# Patient Record
Sex: Female | Born: 1980 | Race: Black or African American | Hispanic: No | Marital: Married
Health system: Midwestern US, Community
[De-identification: ages and names within clinical notes are randomized; demographics above are authoritative.]

## PROBLEM LIST (undated history)

## (undated) DIAGNOSIS — D251 Intramural leiomyoma of uterus: Principal | ICD-10-CM

## (undated) DIAGNOSIS — D509 Iron deficiency anemia, unspecified: Secondary | ICD-10-CM

## (undated) DIAGNOSIS — Z1231 Encounter for screening mammogram for malignant neoplasm of breast: Secondary | ICD-10-CM

## (undated) DIAGNOSIS — D259 Leiomyoma of uterus, unspecified: Secondary | ICD-10-CM

## (undated) DIAGNOSIS — D649 Anemia, unspecified: Secondary | ICD-10-CM

## (undated) HISTORY — DX: Anemia, unspecified: D64.9

## (undated) HISTORY — PX: DILATION AND CURETTAGE OF UTERUS: SHX78

## (undated) HISTORY — DX: Leiomyoma of uterus, unspecified: D25.9

---

## 2015-05-29 ENCOUNTER — Encounter: Payer: BLUE CROSS/BLUE SHIELD | Attending: Family | Primary: Family

## 2015-07-04 ENCOUNTER — Encounter: Payer: BLUE CROSS/BLUE SHIELD | Attending: Family | Primary: Family

## 2015-07-12 ENCOUNTER — Ambulatory Visit: Admit: 2015-07-12 | Discharge: 2015-07-12 | Payer: BLUE CROSS/BLUE SHIELD | Attending: Family | Primary: Family

## 2015-07-12 DIAGNOSIS — Z0000 Encounter for general adult medical examination without abnormal findings: Secondary | ICD-10-CM

## 2015-07-12 LAB — AMB POC URINALYSIS DIP STICK MANUAL W/ MICRO
Amorphous Crystals: 0
Bilirubin (UA POC): NEGATIVE
Blood (UA POC): NEGATIVE
Casts: 0 [HPF] (ref 0–1)
Crystals (UA POC): 0
Glucose (UA POC): NEGATIVE
Ketones (UA POC): NEGATIVE
Leukocyte esterase (UA POC): NEGATIVE
Nitrites (UA POC): NEGATIVE
Other:: 0
Protein (UA POC): NEGATIVE mg/dL
RBC: 0 [HPF] (ref 0–3)
Specific gravity (UA POC): 1.015 (ref 1.003–1.035)
Urobilinogen (UA POC): 0.2 (ref 0.2–1)
pH (UA POC): 5 (ref 4.6–8.0)

## 2015-07-12 MED ORDER — HYDROXYZINE 25 MG TAB
25 mg | ORAL_TABLET | Freq: Every evening | ORAL | 5 refills | Status: DC
Start: 2015-07-12 — End: 2016-01-10

## 2015-07-12 NOTE — Progress Notes (Signed)
Chief Complaint   Patient presents with   ??? Complete Physical     r#3   ??? Other     LMP  07/03/15        Dorothy Gomez is a 34 y.o. female in the office today for physical and establishment States she is doing well. She is fasting today for labs. She denies any headaches or vision changes. No tinnitus or dizziness. No difficulty swallowing. No shortness of breath, cough or congestion. No chest pain or palpitations. No epigastric pain. No nausea. Bowel movements are regular. No dark, tarry or bloody stools. She does report some abdominal pain when she eats gluten. States her mom has Celiac disease. Reports when she eats gluten her feet swell and her stomach hurts. Has very large bowel movements following. Complains of significant bloating. She wants to be tested for Celiac today. Denies breast changes. Periods are regular. Complains of menorrhagia and mild dysmenorrhea. She is due for pap today. She denies any muscle aches or joint pain. She does not sleep well. States diet has been balanced including fruits, vegetables and proteins. Patient is exercising regularly.  Reports occasional alcohol use and does not use tobacco products.  Does see the dentist regularly and has kept up with opthalmologic exams as appropriate.  When patient is out in sun for extended periods of time sun block is used. Reports stable mood.  ROS below for purpose of exam today.     Past Medical History   Diagnosis Date   ??? 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 Vitiligo        No current outpatient prescriptions on file prior to visit.     No current facility-administered medications on file prior to visit.        Past Surgical History   Procedure Laterality Date   ??? Hx gyn       D&C       Family History   Problem Relation Age of Onset   ??? Celiac Disease Mother    ??? Diabetes Mother    ??? Hypertension Mother    ??? Hypertension Father    ??? No Known Problems Sister    ??? Asthma Brother    ??? Hypertension Maternal Grandmother     ??? Diabetes Maternal Grandmother    ??? No Known Problems Maternal Grandfather    ??? Breast Cancer Paternal Grandmother    ??? Diabetes Paternal Grandmother    ??? Hypertension Paternal Grandmother    ??? Heart Attack Paternal Grandfather        No Known Allergies    Social History     Social History   ??? Marital status: SINGLE     Spouse name: N/A   ??? Number of children: N/A   ??? Years of education: N/A     Occupational History   ??? Not on file.     Social History Main Topics   ??? Smoking status: Former Smoker     Quit date: 05/27/2015   ??? Smokeless tobacco: Never Used   ??? Alcohol use 4.2 oz/week     3 Glasses of wine, 0 Cans of beer, 4 Shots of liquor per week   ??? Drug use: No   ??? Sexual activity: Not Currently     Other Topics Concern   ??? Not on file     Social History Narrative       Review of Systems -  General ROS: negative for - chills, fatigue,  fever, night sweats or weight loss  Psychological ROS: negative for - anxiety, depression, mood swings or sleep disturbances  Ophthalmic ROS: negative for - blurry vision, decreased vision, double vision, dry eyes, eye pain or loss of vision  ENT ROS: negative for - epistaxis, headaches, hearing change, nasal congestion, nasal discharge, oral lesions, sinus pain, sneezing, sore throat or tinnitus  Allergy and Immunology ROS: negative for - hives, itchy/watery eyes, nasal congestion, postnasal drip or seasonal allergies  Hematological and Lymphatic ROS: negative for - bleeding problems, bruising, fatigue, night sweats or swollen lymph nodes  Endocrine ROS: negative for - malaise/lethargy, polydipsia/polyuria or unexpected weight changes  Breast ROS: negative for breast lumps  Respiratory ROS: no cough, shortness of breath, or wheezing  Cardiovascular ROS: no chest pain or dyspnea on exertion  Gastrointestinal ROS: no abdominal pain, change in bowel habits, or black or bloody stools  Genito-Urinary ROS: no dysuria, trouble voiding, or hematuria   Musculoskeletal ROS: negative negative for - gait disturbance, joint pain, joint stiffness, joint swelling or muscular weakness  Neurological ROS: negative for - dizziness, numbness/tingling, tremors or weakness  Dermatological ROS: negative for - dry skin, eczema, mole changes or rash      Visit Vitals   ??? BP 104/68   ??? Temp 97.7 ??F (36.5 ??C) (Tympanic)   ??? Ht 5' 4.75" (1.645 m)   ??? Wt 191 lb (86.6 kg)   ??? LMP 07/03/2015   ??? BMI 32.03 kg/m2         Physical Examination:   General appearance - alert, well appearing, and in no distress, oriented to person, place, and time, normal appearing weight and acyanotic, in no respiratory distress  Mental status - alert, oriented to person, place, and time, normal mood, behavior, speech, dress, motor activity, and thought processes  Eyes - pupils equal and reactive, extraocular eye movements intact, sclera anicteric  Ears - bilateral TM's and external ear canals normal  Nose - normal and patent, no erythema, discharge or polyps  Mouth - mucous membranes moist, pharynx normal without lesions, tonsils normal, dental hygiene good and tongue normal  Neck - supple, no significant adenopathy, carotids upstroke normal bilaterally, no bruits, thyroid exam: thyroid is normal in size without nodules or tenderness  Lymphatics - no palpable lymphadenopathy, no hepatosplenomegaly  Chest - clear to auscultation, no wheezes, rales or rhonchi, symmetric air entry  Breast - breasts appear normal, no suspicious masses, no skin or nipple changes or axillary nodes.  Heart - normal rate, regular rhythm, normal S1, S2, no murmurs, rubs, clicks or gallops  Abdomen - soft, nontender, nondistended, no masses or organomegaly  GU - deferred to GYN - normal external genitalia, vulva, vagina, cervix, uterus and adnexa, PAP: Pap smear done today, exam chaperoned  Back exam - full range of motion, no tenderness, palpable spasm or pain on motion   Neurological - alert, oriented, normal speech, no focal findings or movement disorder noted  Musculoskeletal - no joint tenderness, deformity or swelling  Extremities - peripheral pulses normal, no pedal edema, no clubbing or cyanosis  Skin - normal coloration and turgor, no rashes, no suspicious skin lesions noted    Assessment/plan    1. Routine general medical examination at a health care facility  Overall appears to be in good physical condition.  Routine screening labs obtained and will be reviewed upon return. Encouraged to start or maintain healthy diet and or routine exercise. Reviewed and discussed appropriate vaccines and screenings.  Reviewed safety issues  common for age bracket    2. Laboratory examination ordered as part of a routine general medical examination  All labs will be reviewed in detail and addressed as needed.   - CBC WITH AUTOMATED DIFF  - METABOLIC PANEL, COMPREHENSIVE  - LIPID PANEL WITH LDL/HDL RATIO  - TSH 3RD GENERATION  - AMB POC URINALYSIS DIP STICK MANUAL W/ MICRO  - COLLECTION VENOUS BLOOD,VENIPUNCTURE    3. Encounter for routine gynecological examination with Papanicolaou smear of cervix  - Pap (Image Guided), Aptima HPV, rfx 16/18,45 ([id number] /[id number])    4. Lower abdominal pain  Reviewed avoidance of trigger foods.  - Celiac Disease Ab Screen w/Rfx ([id number] /[id number])    5. BMI 32.0-32.9,adult  Discussed the patient's above normal BMI with her.  I have recommended the following interventions: dietary management education, guidance, and counseling .  The BMI follow up plan is as follows: BMI is out of normal parameters and plan is as follows: I have counseled this patient on diet and exercise regimens        Orders Placed This Encounter   ??? COLLECTION VENOUS BLOOD,VENIPUNCTURE   ??? Celiac Disease Ab Screen w/Rfx ([id number] /[id number])   ??? CBC WITH AUTOMATED DIFF   ??? METABOLIC PANEL, COMPREHENSIVE   ??? LIPID PANEL WITH LDL/HDL RATIO   ??? TSH 3RD GENERATION    ??? AMB POC URINALYSIS DIP STICK MANUAL W/ MICRO   ??? Pap (Image Guided), Aptima HPV, rfx 16/18,45 [phone])     Order Specific Question:   Pap Source?     Answer:   Cervical     Order Specific Question:   Total Hysterectomy?     Answer:   No     Order Specific Question:   Supracervical Hysterectomy?     Answer:   No     Order Specific Question:   Post Menopausal?     Answer:   No     Order Specific Question:   Hormone Therapy?     Answer:   No     Order Specific Question:   IUD?     Answer:   No     Order Specific Question:   Abnormal Bleeding?     Answer:   No     Order Specific Question:   Pregnant     Answer:   No     Order Specific Question:   Post Partum?     Answer:   No     Order Specific Question:   Date of LMP?     Answer:   07/03/2015     Order Specific Question:   Pap collection method?     Answer:   Jefm Petty, NP

## 2015-07-12 NOTE — Patient Instructions (Addendum)
Learning About Sleeping Well  What does sleeping well mean?  Sleeping well means getting enough sleep. How much sleep is enough varies among people.  The number of hours you sleep is not as important as how you feel when you wake up. If you do not feel refreshed, you probably need more sleep. Another sign of not getting enough sleep is feeling tired during the day.  The average total nightly sleep time is 7?? to 8 hours. Healthy adults may need a little more or a little less than this.  Why is getting enough sleep important?  Getting enough quality sleep is a basic part of good health. When your sleep suffers, your mood and your thoughts can suffer too. You may find yourself feeling more grumpy or stressed. Not getting enough sleep also can lead to serious problems, including injury, accidents, anxiety, and depression.  What might cause poor sleeping?  Many things can cause sleep problems, including:  ?? Stress. Stress can be caused by fear about a single event, such as giving a speech. Or you may have ongoing stress, such as worry about work or school.  ?? Depression, anxiety, and other mental or emotional conditions.  ?? Changes in your sleep habits or surroundings. This includes changes that happen where you sleep, such as noise, light, or sleeping in a different bed. It also includes changes in your sleep pattern, such as having jet lag or working a late shift.  ?? Health problems, such as pain, breathing problems, and restless legs syndrome.  ?? Lack of regular exercise.  How can you help yourself?  Here are some tips that may help you sleep more soundly and wake up feeling more refreshed.  Your sleeping area   ?? Use your bedroom only for sleeping and sex. A bit of light reading may help you fall asleep. But if it doesn't, do your reading elsewhere in the house. Don't watch TV in bed.  ?? Be sure your bed is big enough to stretch out comfortably, especially if you have a sleep partner.   ?? Keep your bedroom quiet, dark, and cool. Use curtains, blinds, or a sleep mask to block out light. To block out noise, use earplugs, soothing music, or a "white noise" machine.  Your evening and bedtime routine   ?? Create a relaxing bedtime routine. You might want to take a warm shower or bath, listen to soothing music, or drink a cup of noncaffeinated tea.  ?? Go to bed at the same time every night. And get up at the same time every morning, even if you feel tired.  What to avoid   ?? Limit caffeine (coffee, tea, caffeinated sodas) during the day, and don't have any for at least 4 to 6 hours before bedtime.  ?? Don't drink alcohol before bedtime. Alcohol can cause you to wake up more often during the night.  ?? Don't smoke or use tobacco, especially in the evening. Nicotine can keep you awake.  ?? Don't take naps during the day, especially close to bedtime.  ?? Don't lie in bed awake for too long. If you can't fall asleep, or if you wake up in the middle of the night and can't get back to sleep within 15 minutes or so, get out of bed and go to another room until you feel sleepy.  ?? Don't take medicine right before bed that may keep you awake or make you feel hyper or energized. Your doctor can tell you if your   medicine may do this and if you can take it earlier in the day.  If you can't sleep   ?? Imagine yourself in a peaceful, pleasant scene. Focus on the details and feelings of being in a place that is relaxing.  ?? Get up and do a quiet or boring activity until you feel sleepy.  ?? Don't drink any liquids after 6 p.m. if you wake up often because you have to go to the bathroom.  Where can you learn more?  Go to http://www.healthwise.net/GoodHelpConnections  Enter J942 in the search box to learn more about "Learning About Sleeping Well."  ?? 2006-2016 [employer]. Care instructions adapted under license by [employer] (which disclaims liability or warranty  for this information). This care instruction is for use with your licensed healthcare professional. If you have questions about a medical condition or this instruction, always ask your healthcare professional. Healthwise, Incorporated disclaims any warranty or liability for your use of this information.  Content Version: 10.9.538570; Current as of: October 16, 2014

## 2015-07-13 LAB — LIPID PANEL WITH LDL/HDL RATIO
Cholesterol, total: 160 mg/dL (ref 100–199)
HDL Cholesterol: 66 mg/dL (ref >39)
LDL, calculated: 79 mg/dL (ref 0–99)
LDL/HDL Ratio: 1.2 ratio units (ref 0.0–3.2)
Triglyceride: 75 mg/dL (ref 0–149)
VLDL, calculated: 15 mg/dL (ref 5–40)

## 2015-07-13 LAB — CBC WITH AUTOMATED DIFF
ABS. BASOPHILS: 0 10*3/uL (ref 0.0–0.2)
ABS. EOSINOPHILS: 0 10*3/uL (ref 0.0–0.4)
ABS. IMM. GRANS.: 0 10*3/uL (ref 0.0–0.1)
ABS. MONOCYTES: 0.3 10*3/uL (ref 0.1–0.9)
ABS. NEUTROPHILS: 4.7 10*3/uL (ref 1.4–7.0)
Abs Lymphocytes: 1.7 10*3/uL (ref 0.7–3.1)
BASOPHILS: 0 %
EOSINOPHILS: 1 %
HCT: 35.6 % (ref 34.0–46.6)
HGB: 10.5 g/dL — ABNORMAL LOW (ref 11.1–15.9)
IMMATURE GRANULOCYTES: 0 %
Lymphocytes: 25 %
MCH: 20.5 pg — ABNORMAL LOW (ref 26.6–33.0)
MCHC: 29.5 g/dL — ABNORMAL LOW (ref 31.5–35.7)
MCV: 69 fL — ABNORMAL LOW (ref 79–97)
MONOCYTES: 5 %
NEUTROPHILS: 69 %
PLATELET: 234 10*3/uL (ref 150–379)
RBC: 5.13 x10E6/uL (ref 3.77–5.28)
RDW: 17.1 % — ABNORMAL HIGH (ref 12.3–15.4)
WBC: 6.8 10*3/uL (ref 3.4–10.8)

## 2015-07-13 LAB — METABOLIC PANEL, COMPREHENSIVE
A-G Ratio: 1.5 (ref 1.1–2.5)
ALT (SGPT): 9 IU/L (ref 0–32)
AST (SGOT): 13 IU/L (ref 0–40)
Albumin: 4.1 g/dL (ref 3.5–5.5)
Alk. phosphatase: 67 IU/L (ref 39–117)
BUN/Creatinine ratio: 12 (ref 8–20)
BUN: 9 mg/dL (ref 6–20)
Bilirubin, total: 0.4 mg/dL (ref 0.0–1.2)
CO2: 24 mmol/L (ref 18–29)
Calcium: 9.2 mg/dL (ref 8.7–10.2)
Chloride: 101 mmol/L (ref 97–108)
Creatinine: 0.77 mg/dL (ref 0.57–1.00)
GFR est AA: 115 mL/min/{1.73_m2} (ref >59)
GFR est non-AA: 100 mL/min/{1.73_m2} (ref >59)
GLOBULIN, TOTAL: 2.7 g/dL (ref 1.5–4.5)
Glucose: 94 mg/dL (ref 65–99)
Potassium: 4 mmol/L (ref 3.5–5.2)
Protein, total: 6.8 g/dL (ref 6.0–8.5)
Sodium: 140 mmol/L (ref 134–144)

## 2015-07-13 LAB — TSH 3RD GENERATION: TSH: 0.993 u[IU]/mL (ref 0.450–4.500)

## 2015-07-15 ENCOUNTER — Encounter

## 2015-07-15 LAB — CELIAC DISEASE AB SCREEN W/RFLX
DEAMIDATED GLIADIN ABS,IGA, 161651: 6 units (ref 0–19)
Deamidated Gliadin Ab, IgA: 6 units (ref 0–19)
Immunoglobulin A, Qt.: 233 mg/dL (ref 87–352)
Immunoglobulin A, Qt.: 233 mg/dL (ref 87–352)
T-TRANSGLUTAMINASE, IGA, 164643: <2 U/mL (ref 0–3)
t-Transglutaminase, IgA: <2 U/mL (ref 0–3)

## 2015-07-15 MED ORDER — FERROUS SULFATE 324 MG (65 MG IRON) TAB, DELAYED RELEASE
324 mg (65 mg iron) | ORAL_TABLET | Freq: Every day | ORAL | 1 refills | Status: DC
Start: 2015-07-15 — End: 2016-11-23

## 2015-07-15 NOTE — Progress Notes (Signed)
Anemic, liver and kidney function is good, glucose is normal, cholesterol is great, thyroid is normal, start iron supplement (sent to pharmacy on file), may cause constipation, increase water intake, otc stool softener as needed, dietary fiber

## 2015-07-15 NOTE — Progress Notes (Signed)
Pt notified and voiced understanding  Pt wants to know if her celiac lab results are back yet

## 2015-07-15 NOTE — Progress Notes (Signed)
Labs just resulted, negative celiac panel

## 2015-07-15 NOTE — Progress Notes (Signed)
lvm w/ results per pts request

## 2015-07-16 LAB — PAP IG, APTIMA HPV AND RFX 16/18,45 (199305)
HPV Aptima: NEGATIVE
LABCORP 019018: 0

## 2015-07-16 LAB — PAP IG, APTIMA HPV AND RFX 16/18,45 (507805)
.: 0
HPV APTIMA: NEGATIVE

## 2015-07-16 NOTE — Progress Notes (Signed)
Normal pap, negative HPV

## 2015-07-17 NOTE — Progress Notes (Signed)
Left voice message asking if it's ok to leave  results on voice mail

## 2015-07-17 NOTE — Progress Notes (Signed)
Left message with below info

## 2015-07-26 NOTE — Telephone Encounter (Signed)
Lab appt will be ok

## 2015-07-26 NOTE — Telephone Encounter (Signed)
Said that she had a low vitamin D levels in the past and she would like to have it checked again.  Does she need to make an appt to see you or can she just get a lab appt?

## 2015-07-26 NOTE — Telephone Encounter (Signed)
Pt notified and voiced understanding

## 2016-01-10 ENCOUNTER — Ambulatory Visit: Admit: 2016-01-10 | Discharge: 2016-01-10 | Payer: BLUE CROSS/BLUE SHIELD | Attending: Family | Primary: Family

## 2016-01-10 DIAGNOSIS — N920 Excessive and frequent menstruation with regular cycle: Secondary | ICD-10-CM

## 2016-01-10 NOTE — Progress Notes (Signed)
Chief Complaint   Patient presents with   ??? Menstrual Problem     very painful.  r#3   ??? Other     DERM referal   ??? Anxiety       HPI:  Dorothy Gomez is a 35 y.o. female presenting to the office today for an acute visit. States her menstrual cycles has become very painful over the past year. She complains of menorrhagia. Reports length is 5 days. States she is worried about fibroids and endometriosis. Discussed trial OCP and patient states she does not do well on "hormones". We will refer her to GYN for further evaluation and management. She is going through a divorce and has increased anxiety. She denies any sadness or anhedonia. She does have some insomnia. No suicidal or homicidal ideations. She is going to see a new therapist this coming week. States will follow up in our office if she continues to have symptoms following therapy. States her vitiligo is worsened with her anxiety. She would like to be referred to Dermatology. She denies further needs or complaints today.     Past Medical History:   Diagnosis Date   ??? 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 Vitiligo        Social History     Social History   ??? Marital status: SINGLE     Spouse name: N/A   ??? Number of children: N/A   ??? Years of education: N/A     Occupational History   ??? Not on file.     Social History Main Topics   ??? Smoking status: Former Smoker     Quit date: 05/27/2015   ??? Smokeless tobacco: Never Used   ??? Alcohol use 4.2 oz/week     3 Glasses of wine, 0 Cans of beer, 4 Shots of liquor per week   ??? Drug use: No   ??? Sexual activity: Not Currently     Other Topics Concern   ??? Not on file     Social History Narrative       Family History   Problem Relation Age of Onset   ??? Celiac Disease Mother    ??? Diabetes Mother    ??? Hypertension Mother    ??? Hypertension Father    ??? No Known Problems Sister    ??? Asthma Brother    ??? Hypertension Maternal Grandmother    ??? Diabetes Maternal Grandmother     ??? No Known Problems Maternal Grandfather    ??? Breast Cancer Paternal Grandmother    ??? Diabetes Paternal Grandmother    ??? Hypertension Paternal Grandmother    ??? Heart Attack Paternal Grandfather        Current Outpatient Prescriptions   Medication Sig Dispense Refill   ??? ferrous sulfate 324 mg (65 mg iron) tablet Take 1 Tab by mouth Daily (before breakfast). 90 Tab 1       Review of Systems - History obtained from the patient  General ROS: negative for - fatigue, fever or weight loss  Psychological ROS: positive for - anxiety  negative for - concentration difficulties, depression, sleep disturbances or suicidal ideation  Respiratory ROS: no cough, shortness of breath, or wheezing  Cardiovascular ROS: no chest pain or dyspnea on exertion  Genito-Urinary ROS: positive for - dysmenorrhea and dysmenorrhea    Vitals:    01/10/16 1125   BP: 112/72   Temp: 96.2 ??F (35.7 ??C)   TempSrc: Tympanic   Weight: 194  lb (88 kg)   Height: 5' 4.75" (1.645 m)       Physical Examination: General appearance - alert, well appearing, and in no distress  Mental status - alert, oriented to person, place, and time, normal mood, behavior, speech, dress, motor activity, and thought processes  Neck - supple, no significant adenopathy  Chest - clear to auscultation, no wheezes, rales or rhonchi, symmetric air entry  Heart - normal rate, regular rhythm, normal S1, S2, no murmurs, rubs, clicks or gallops  Neurological - alert, oriented, normal speech, no focal findings or movement disorder noted  Extremities - peripheral pulses normal, no pedal edema, no clubbing or cyanosis  Skin - normal coloration and turgor    Assessment and Plan:    1. Menorrhagia with regular cycle  - OB/GYN - [facility]    2. Vitiligo  - DERMATOLOGY    3. Anxiety  States going to see [place], therapist on [address] next week. Will follow up if she feels therapy is not adequate for initiation of medication.       Adria Dill, NP

## 2016-01-10 NOTE — Patient Instructions (Addendum)
Anxiety Disorder: Care Instructions  Your Care Instructions  Anxiety is a normal reaction to stress. Difficult situations can cause you to have symptoms such as sweaty palms and a nervous feeling.  In an anxiety disorder, the symptoms are far more severe. Constant worry, muscle tension, trouble sleeping, nausea and diarrhea, and other symptoms can make normal daily activities difficult or impossible. These symptoms may occur for no reason, and they can affect your work, school, or social life. Medicines, counseling, and self-care can all help.  Follow-up care is a key part of your treatment and safety. Be sure to make and go to all appointments, and call your doctor if you are having problems. It's also a good idea to know your test results and keep a list of the medicines you take.  How can you care for yourself at home?  ?? Take medicines exactly as directed. Call your doctor if you think you are having a problem with your medicine.  ?? Go to your counseling sessions and follow-up appointments.  ?? Recognize and accept your anxiety. Then, when you are in a situation that makes you anxious, say to yourself, "This is not an emergency. I feel uncomfortable, but I am not in danger. I can keep going even if I feel anxious."  ?? Be kind to your body:  ?? Relieve tension with exercise or a massage.  ?? Get enough rest.  ?? Avoid alcohol, caffeine, nicotine, and illegal drugs. They can increase your anxiety level and cause sleep problems.  ?? Learn and do relaxation techniques. See below for more about these techniques.  ?? Engage your mind. Get out and do something you enjoy. Go to a funny movie, or take a walk or hike. Plan your day. Having too much or too little to do can make you anxious.  ?? Keep a record of your symptoms. Discuss your fears with a good friend or family member, or join a support group for people with similar problems. Talking to others sometimes relieves stress.   ?? Get involved in social groups, or volunteer to help others. Being alone sometimes makes things seem worse than they are.  ?? Get at least 30 minutes of exercise on most days of the week to relieve stress. Walking is a good choice. You also may want to do other activities, such as running, swimming, cycling, or playing tennis or team sports.  Relaxation techniques  Do relaxation exercises 10 to 20 minutes a day. You can play soothing, relaxing music while you do them, if you wish.  ?? Tell others in your house that you are going to do your relaxation exercises. Ask them not to disturb you.  ?? Find a comfortable place, away from all distractions and noise.  ?? Lie down on your back, or sit with your back straight.  ?? Focus on your breathing. Make it slow and steady.  ?? Breathe in through your nose. Breathe out through either your nose or mouth.  ?? Breathe deeply, filling up the area between your navel and your rib cage. Breathe so that your belly goes up and down.  ?? Do not hold your breath.  ?? Breathe like this for 5 to 10 minutes. Notice the feeling of calmness throughout your whole body.  As you continue to breathe slowly and deeply, relax by doing the following for another 5 to 10 minutes:  ?? Tighten and relax each muscle group in your body. You can begin at your toes and work your   way up to your head.  ?? Imagine your muscle groups relaxing and becoming heavy.  ?? Empty your mind of all thoughts.  ?? Let yourself relax more and more deeply.  ?? Become aware of the state of calmness that surrounds you.  ?? When your relaxation time is over, you can bring yourself back to alertness by moving your fingers and toes and then your hands and feet and then stretching and moving your entire body. Sometimes people fall asleep during relaxation, but they usually wake up shortly afterward.  ?? Always give yourself time to return to full alertness before you drive a  car or do anything that might cause an accident if you are not fully alert. Never play a relaxation tape while you drive a car.  When should you call for help?  Call 911 anytime you think you may need emergency care. For example, call if:  ?? You feel you cannot stop from hurting yourself or someone else.  Keep the numbers for these national suicide hotlines: 1-800-273-TALK (1-800-273-8255) and 1-800-SUICIDE (1-800-784-2433). If you or someone you know talks about suicide or feeling hopeless, get help right away.  Watch closely for changes in your health, and be sure to contact your doctor if:  ?? You have anxiety or fear that affects your life.  ?? You have symptoms of anxiety that are new or different from those you had before.  Where can you learn more?  Go to http://www.healthwise.net/GoodHelpConnections.  Enter P754 in the search box to learn more about "Anxiety Disorder: Care Instructions."  Current as of: May 21, 2015  Content Version: 11.1  ?? 2006-2016 [employer], [employer]. Care instructions adapted under license by [employer] (which disclaims liability or warranty for this information). If you have questions about a medical condition or this instruction, always ask your healthcare professional. [employer], [employer] disclaims any warranty or liability for your use of this information.

## 2016-01-17 ENCOUNTER — Ambulatory Visit: Admit: 2016-01-17 | Discharge: 2016-01-17 | Payer: BLUE CROSS/BLUE SHIELD | Attending: Family | Primary: Family

## 2016-01-17 DIAGNOSIS — J329 Chronic sinusitis, unspecified: Secondary | ICD-10-CM

## 2016-01-17 LAB — AMB POC COMPLETE CBC,AUTOMATED ENTER (RFM)
GRANULOCYTES (POC): 66.7 % (ref 47.0–75.0)
HCT (POC): 38.8 % (ref 36.7–44.7)
HGB (POC): 11.7 g/dL — AB (ref 12.0–16.0)
LYMPHOCYTES (POC): 21.5 % (ref 15.0–45.0)
MCH (POC): 20 pg — AB (ref 26–34)
MCHC (POC): 30.1 g/dL — AB (ref 31–37)
MCV (POC): 66.6 fL — AB (ref 80–100)
MONOCYTES (POC): 11.8 % — AB (ref 2.0–11.0)
PLATELET (POC): 198 10*3/uL (ref 140–440)
RBC (POC): 5.83 10*6/uL — AB (ref 3.98–5.00)
WBC (POC): 10.1 10*3/uL — AB (ref 3.9–10.0)

## 2016-01-17 MED ORDER — ALBUTEROL SULFATE HFA 90 MCG/ACTUATION AEROSOL INHALER
90 mcg/actuation | RESPIRATORY_TRACT | 0 refills | Status: DC | PRN
Start: 2016-01-17 — End: 2016-12-02

## 2016-01-17 MED ORDER — CEFDINIR 300 MG CAP
300 mg | ORAL_CAPSULE | Freq: Every day | ORAL | 0 refills | Status: DC
Start: 2016-01-17 — End: 2016-11-23

## 2016-01-17 MED ORDER — BROMPHENIRAMINE-PSEUDOEPHEDRINE-DM 2 MG-30 MG-10 MG/5 ML SYRUP
2-30-10 mg/5 mL | Freq: Four times a day (QID) | ORAL | 0 refills | Status: DC | PRN
Start: 2016-01-17 — End: 2016-11-23

## 2016-01-17 MED ORDER — FLUCONAZOLE 150 MG TAB
150 mg | ORAL_TABLET | Freq: Every day | ORAL | 0 refills | Status: AC
Start: 2016-01-17 — End: 2016-01-18

## 2016-01-17 NOTE — Progress Notes (Signed)
Chief Complaint   Patient presents with   ??? Wheezing     cough, drainage, chest congestion since  yesterday.   r#1       HPI:  Dorothy Gomez is a 35 y.o. female presenting to the office today for an acute visit. States she began feeling poorly a few days ago. Feels worse since yesterday. Symptoms started with allergy like symptoms. She has sinus pain, pressure and congestion. She feels like her sinuses are draining. No sore throat. She has bilateral ear pressure and popping, no pain. She is coughing. Cough is not productive. She is wheezing. She does have some mild shortness of breath with activity. No chest pain or palpitations. No fever or chills. She feels fatigued. Has been taking OTC medication without relief. No further issues or complaints.     Past Medical History:   Diagnosis Date   ??? 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 Vitiligo        Social History     Social History   ??? Marital status: SINGLE     Spouse name: N/A   ??? Number of children: N/A   ??? Years of education: N/A     Occupational History   ??? Not on file.     Social History Main Topics   ??? Smoking status: Former Smoker     Quit date: 05/27/2015   ??? Smokeless tobacco: Never Used   ??? Alcohol use 4.2 oz/week     3 Glasses of wine, 0 Cans of beer, 4 Shots of liquor per week   ??? Drug use: No   ??? Sexual activity: Not Currently     Other Topics Concern   ??? Not on file     Social History Narrative       Family History   Problem Relation Age of Onset   ??? Celiac Disease Mother    ??? Diabetes Mother    ??? Hypertension Mother    ??? Hypertension Father    ??? No Known Problems Sister    ??? Asthma Brother    ??? Hypertension Maternal Grandmother    ??? Diabetes Maternal Grandmother    ??? No Known Problems Maternal Grandfather    ??? Breast Cancer Paternal Grandmother    ??? Diabetes Paternal Grandmother    ??? Hypertension Paternal Grandmother    ??? Heart Attack Paternal Grandfather        Current Outpatient Prescriptions   Medication Sig Dispense Refill    ??? ferrous sulfate 324 mg (65 mg iron) tablet Take 1 Tab by mouth Daily (before breakfast). 90 Tab 1       Review of Systems - History obtained from the patient  General ROS: positive for  - fatigue  negative for - chills or fever  ENT ROS: positive for - headaches, nasal congestion and ear pain and pressure, laryngitis  negative for - headaches, nasal discharge, sneezing, sore throat or tinnitus  Respiratory ROS: positive for - cough and shortness of breath  negative for - hemoptysis, orthopnea or wheezing  Cardiovascular ROS: no chest pain or dyspnea on exertion    Vitals:    01/17/16 1059   BP: 112/74   Temp: 97.8 ??F (36.6 ??C)   TempSrc: Tympanic   Weight: 195 lb (88.5 kg)   Height: 5' 4.75" (1.645 m)       Physical Examination: General appearance - acyanotic, in no respiratory distress and ill-appearing  Mental status - alert, oriented to person,  place, and time, normal mood, behavior, speech, dress, motor activity, and thought processes  Eyes - pupils equal and reactive, extraocular eye movements intact  Ears - right TM red, dull, bulging, left TM red, dull, bulging, clear effusions bilaterally  Nose - mucosal congestion and mucosal erythema  Mouth - mucous membranes moist, pharynx normal without lesions  Neck - supple, no significant adenopathy  Chest - clear to auscultation, no wheezes, rales or rhonchi, symmetric air entry  Heart - normal rate, regular rhythm, normal S1, S2, no murmurs, rubs, clicks or gallops  Neurological - alert, oriented, normal speech, no focal findings or movement disorder noted  Extremities - no pedal edema noted  Skin - normal coloration and turgor, no rashes, no suspicious skin lesions noted    Assessment and Plan:    1. Sinusitis, unspecified chronicity, unspecified location  At this time I do think this illness requires use of antibiotics.  Advised to take full course of medication even if feeling better. Side effects  reviewed in detail. Take with food. Recommended over counter medications to control symptoms.  Explained the patient should contact our office or seek emergency care if develops shortness of breath or worsens. Additionally asked to contact our office if not improving in 4-5 days or sooner if worse  - cefdinir (OMNICEF) 300 mg capsule; Take 2 Caps by mouth daily.  Dispense: 20 Cap; Refill: 0  - fluconazole (DIFLUCAN) 150 mg tablet; Take 1 Tab by mouth daily for 1 day. FDA advises cautious prescribing of oral fluconazole in pregnancy.  Dispense: 1 Tab; Refill: 0  - CBC (85025)  - Fingerstick [id number])    2. Cough  Side effects reviewed. Follow up for any new or worsening symptoms.   - Brompheniramine-Pseudoeph-DM (BROMFED DM) 2-30-10 mg/5 mL syrup; Take 5 mL by mouth four (4) times daily as needed.  Dispense: 200 mL; Refill: 0  - CBC (85025)  - Fingerstick [id number])    3. Wheezing  Follow up if not improving for any new or worsening symptoms.   - albuterol (PROVENTIL HFA, VENTOLIN HFA, PROAIR HFA) 90 mcg/actuation inhaler; Take 1 Puff by inhalation every four (4) hours as needed for Wheezing.  Dispense: 1 Inhaler; Refill: 0  - CBC (85025)  - Fingerstick (36416)    4. Laryngitis  Could benefit from short burst of prednisone, declined by patient.   - CBC [id number])  - Fingerstick [id number])      Adria Dill, NP

## 2016-01-17 NOTE — Patient Instructions (Addendum)
Cough: Care Instructions  Your Care Instructions  A cough is your body's response to something that bothers your throat or airways. Many things can cause a cough. You might cough because of a cold or the flu, bronchitis, or asthma. Smoking, postnasal drip, allergies, and stomach acid that backs up into your throat also can cause coughs.  A cough is a symptom, not a disease. Most coughs stop when the cause, such as a cold, goes away. You can take a few steps at home to cough less and feel better.  Follow-up care is a key part of your treatment and safety. Be sure to make and go to all appointments, and call your doctor if you are having problems. It's also a good idea to know your test results and keep a list of the medicines you take.  How can you care for yourself at home?  ?? Drink lots of water and other fluids. This helps thin the mucus and soothes a dry or sore throat. Honey or lemon juice in hot water or tea may ease a dry cough.  ?? Take cough medicine as directed by your doctor.  ?? Prop up your head on pillows to help you breathe and ease a dry cough.  ?? Try cough drops to soothe a dry or sore throat. Cough drops don't stop a cough. Medicine-flavored cough drops are no better than candy-flavored drops or hard candy.  ?? Do not smoke. Avoid secondhand smoke. If you need help quitting, talk to your doctor about stop-smoking programs and medicines. These can increase your chances of quitting for good.  When should you call for help?  Call 911 anytime you think you may need emergency care. For example, call if:  ?? You have severe trouble breathing.  Call your doctor now or seek immediate medical care if:  ?? You cough up blood.  ?? You have new or worse trouble breathing.  ?? You have a new or higher fever.  ?? You have a new rash.  Watch closely for changes in your health, and be sure to contact your doctor if:  ?? You cough more deeply or more often, especially if you notice more mucus  or a change in the color of your mucus.  ?? You have new symptoms, such as a sore throat, an earache, or sinus pain.  ?? You do not get better as expected.  Where can you learn more?  Go to http://www.healthwise.net/[employer]Help[employer]ctions.  Enter D279 in the search box to learn more about "Cough: Care Instructions."  Current as of: Mar 22, 2015  Content Version: 11.1  ?? 2006-2016 [employer]. Care instructions adapted under license by [employer] (which disclaims liability or warranty for this information). If you have questions about a medical condition or this instruction, always ask your healthcare professional. Healthwise, Inc[employer] disclaims any warranty or liability for your use of this information.

## 2016-07-16 ENCOUNTER — Encounter: Payer: BLUE CROSS/BLUE SHIELD | Attending: Family | Primary: Family

## 2016-11-23 ENCOUNTER — Inpatient Hospital Stay
Admit: 2016-11-23 | Discharge: 2016-11-23 | Disposition: A | Discharge status: Home / Self Care | Payer: Self-pay | Attending: Emergency Medicine

## 2016-11-23 ENCOUNTER — Emergency Department: Admit: 2016-11-23 | Payer: Self-pay | Primary: Family

## 2016-11-23 DIAGNOSIS — N858 Other specified noninflammatory disorders of uterus: Secondary | ICD-10-CM

## 2016-11-23 LAB — HGB & HCT
HCT: 34.4 % — ABNORMAL LOW (ref 35.8–46.3)
HGB: 10.5 g/dL — ABNORMAL LOW (ref 11.7–15.4)

## 2016-11-23 NOTE — ED Triage Notes (Signed)
Pt states she has a hx of painful periods with heavier bleeding than normal. Pt states she has had some nausea over the past few weeks with breast tenderness and thought she may be pregnant. Pt states she did not have a positive pregnancy test.

## 2016-11-23 NOTE — ED Notes (Signed)
I have reviewed discharge instructions with the patient.  The patient and spouse verbalized understanding.    Patient left ED via Discharge Method: ambulatory to Home with husband    Opportunity for questions and clarification provided.       Patient given 0 scripts.         To continue your aftercare when you leave the hospital, you may receive an automated call from our care team to check in on how you are doing.  This is a free service and part of our promise to provide the best care and service to meet your aftercare needs.??? If you have questions, or wish to unsubscribe from this service please call [phone].  Thank you for Choosing our [facility] Emergency Department.

## 2016-11-23 NOTE — ED Notes (Signed)
Straight cath completed.

## 2016-11-23 NOTE — ED Provider Notes (Addendum)
HPI Comments: 36 year old lady who presents with concerns about bilateral pelvic pain that has gotten worse over the last several hours.  Patient notes that the timing is appropriate for the start of her menstrual cycle but she says that the volume of bleeding in the intensity of the pain is worse than normal.  She says she initially thought over the past couple weeks that she may have gotten pregnant because she was having some morning sickness type symptoms.  However, she did not actually take a pregnancy test.  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  She says she was told that if she were to get pregnant again she should be started on blood thinners to help decrease the risk of a miscarriage.  Patient has had no fevers or chills and no vomiting.    Elements of this note were created using speech recognition software.  As such, errors of speech recognition may be present.    The history is provided by the patient.        Past Medical History:   Diagnosis Date   ??? 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 Vitiligo        Past Surgical History:   Procedure Laterality Date   ??? HX GYN      D&C         Family History:   Problem Relation Age of Onset   ??? Celiac Disease Mother    ??? Diabetes Mother    ??? Hypertension Mother    ??? Hypertension Father    ??? No Known Problems Sister    ??? Asthma Brother    ??? Hypertension Maternal Grandmother    ??? Diabetes Maternal Grandmother    ??? No Known Problems Maternal Grandfather    ??? Breast Cancer Paternal Grandmother    ??? Diabetes Paternal Grandmother    ??? Hypertension Paternal Grandmother    ??? Heart Attack Paternal Grandfather        Social History     Social History   ??? Marital status: MARRIED     Spouse name: N/A   ??? Number of children: N/A   ??? Years of education: N/A     Occupational History   ??? Not on file.     Social History Main Topics   ??? Smoking status: Former Smoker     Quit date: 05/27/2015    ??? Smokeless tobacco: Never Used   ??? Alcohol use 4.2 oz/week     3 Glasses of wine, 0 Cans of beer, 4 Shots of liquor per week   ??? Drug use: No   ??? Sexual activity: Not Currently     Other Topics Concern   ??? Not on file     Social History Narrative         ALLERGIES: Review of patient's allergies indicates no known allergies.    Review of Systems   Constitutional: Negative for chills, diaphoresis and fever.   HENT: Negative for congestion, rhinorrhea and sore throat.    Eyes: Negative for redness and visual disturbance.   Respiratory: Negative for cough, chest tightness, shortness of breath and wheezing.    Cardiovascular: Negative for chest pain and palpitations.   Gastrointestinal: Negative for abdominal pain, blood in stool, diarrhea, nausea and vomiting.   Endocrine: Negative for polydipsia and polyuria.   Genitourinary: Positive for pelvic pain and vaginal bleeding. Negative for dysuria and hematuria.  Musculoskeletal: Negative for arthralgias, myalgias and neck stiffness.   Skin: Negative for rash.   Allergic/Immunologic: Negative for environmental allergies and food allergies.   Neurological: Negative for dizziness, weakness and headaches.   Hematological: Negative for adenopathy. Does not bruise/bleed easily.   Psychiatric/Behavioral: Negative for confusion and sleep disturbance. The patient is not nervous/anxious.        Vitals:    11/23/16 0817   BP: 130/77   Pulse: 72   Resp: 16   Temp: 98.2 ??F (36.8 ??C)   SpO2: 98%   Weight: 88.5 kg (195 lb)   Height: 5\' 7"  (1.702 m)            Physical Exam   Constitutional: She is oriented to person, place, and time. She appears well-developed and well-nourished.   HENT:   Head: Normocephalic and atraumatic.   Eyes: Conjunctivae and EOM are normal. Pupils are equal, round, and reactive to light.   Neck: Normal range of motion.   Cardiovascular: Normal rate and regular rhythm.    Pulmonary/Chest: Effort normal and breath sounds normal. No respiratory  distress. She has no wheezes. She has no rales. She exhibits no tenderness.   Abdominal: Soft. Bowel sounds are normal. There is no rebound and no guarding.   No abdominal tenderness above the pubic region   Genitourinary:   Genitourinary Comments: Bilateral external adnexal tenderness with no masses felt   Musculoskeletal: Normal range of motion. She exhibits no edema or tenderness.   Lymphadenopathy:     She has no cervical adenopathy.   Neurological: She is alert and oriented to person, place, and time.   Skin: Skin is warm and dry.   Intermittent patches of vitiligo   Psychiatric: She has a normal mood and affect.   Nursing note and vitals reviewed.       MDM  Number of Diagnoses or Management Options  Diagnosis management comments: Given her symptoms I will check a pregnancy test as well as an H&H.  She does say that several years ago she was told she had some fibroids so pending the pregnancy test result I will do an ultrasound either to look for fibroids or an ovarian cyst or obviously if patency test is positive we'll look for potential ectopic.    12:06 PM  I discussed the ultrasound findings with the patient.  I then discussed the case with Dr. Janett Billow from gynecology who kindly agreed to follow the patient as an outpatient.    ED Course       Procedures

## 2016-11-23 NOTE — ED Notes (Signed)
Pregnancy test is negative and pt drinking water for ultrasound per request.

## 2016-11-24 NOTE — Telephone Encounter (Signed)
LVM- Per Alt pt needs [place] and MD(ER FU) visit 5-10 days from 11/24/16.cd

## 2016-12-02 ENCOUNTER — Ambulatory Visit: Attending: Obstetrics & Gynecology | Primary: Family

## 2016-12-02 ENCOUNTER — Encounter: Admit: 2016-12-02 | Discharge: 2016-12-02 | Primary: Family

## 2016-12-02 ENCOUNTER — Ambulatory Visit: Admit: 2016-12-02 | Discharge: 2016-12-02 | Attending: Obstetrics & Gynecology | Primary: Family

## 2016-12-02 DIAGNOSIS — N939 Abnormal uterine and vaginal bleeding, unspecified: Secondary | ICD-10-CM

## 2016-12-02 LAB — AMB POC HEMOGLOBIN (HGB): Hemoglobin (POC): 9.2

## 2016-12-02 NOTE — Progress Notes (Signed)
The patient is a 36 y.o. No obstetric history on file. who is seen for an ER follow up from 11/23/16. She went for bilateral pelvic pain and increased vaginal bleeding. She also has a history of fibroids. At the time of the episode she was sleeping and rolled over and was awakened by severe menstrual type pain that brought her to tears, Rt> Lt, US done in ER showed multiple fibroids and pedunculated fibroid vs rt ovarian mass. PT had known history of fibroids diagnosed in [place] with PRotein S deficiency at time of miscarriages. Was told at that time had fibroids. Symptoms have been gradually improving since ER visit. She has had less pain and has not had any more bleeding.     HISTORY:    No obstetric history on file.  Patient's last menstrual period was 11/21/2016 (approximate).  Sexual History:  not sexually active  Contraception:  none  Current Outpatient Prescriptions on File Prior to Visit   Medication Sig Dispense Refill   ??? albuterol (PROVENTIL HFA, VENTOLIN HFA, PROAIR HFA) 90 mcg/actuation inhaler Take 1 Puff by inhalation every four (4) hours as needed for Wheezing. 1 Inhaler 0     No current facility-administered medications on file prior to visit.        ROS:  Feeling well. No dyspnea or chest pain on exertion.  No abdominal pain, change in bowel habits, black or bloody stools.  No urinary tract symptoms. GYN ROS: normal menses, no abnormal bleeding, pelvic pain or discharge, no breast pain or new or enlarging lumps on self exam, she complains of above with heavier periods worsening over last 6 mos.    PHYSICAL EXAM:  Blood pressure 130/82, weight 199 lb (90.3 kg), last menstrual period 11/21/2016.    The patient appears well, alert, oriented x 3, in no distress.  . Abdomen soft witt tenderness- over RLQ, palpable fibroid uterus,   No guarding.  Pelvic: exam declined by the patient.  [place] [id number] uterus with vol of 248 stripe 9.8  Multiple fibroids 3.5, 3.0, 1.6 appearance of pedunculated fibroid in rt  adnexa region measuring 9.1 cm Right ovary visualized and normal  Left ovary with associated cystic structure with septations cc with peritoneal inclusion cyst vs hydrosalpinx 5X 2X 2.5    ASSESSMENT:      ICD-10-CM ICD-9-CM    1. Vagina bleeding N93.9 623.8 AMB POC HEMOGLOBIN (HGB)   2. Intramural, submucous, and subserous leiomyoma of uterus D25.1 218.1 US TRANSVAGINAL    D25.0 218.0 REFERRAL TO ENDOCRINOLOGY    D25.2 218.2    3. Pain in female pelvis R10.2 625.9 US TRANSVAGINAL   4. Adnexal mass N94.9 625.8 US TRANSVAGINAL   5. 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 Protein S deficiency ([place]) D68.59 289.81 REFERRAL TO ENDOCRINOLOGY     Encounter Diagnoses   Name Primary?   ??? Vagina bleeding Yes   ??? Intramural, submucous, and subserous leiomyoma of uterus    ??? Pain in female pelvis    ??? Adnexal mass    ??? Hydrosalpinx    ??? Protein S deficiency ([place])      Orders Placed This Encounter   ??? Transvaginal   ??? ENDOCRINOLOGY   ??? Hemoglobin [id number])   ??? DISCONTD: multivitamin (MULTI-DELYN, WELLESSE) liqd   ??? ferrous sulfate ER (IRON) 160 mg (50 mg iron) TbER tablet   ??? cholecalciferol (VITAMIN D3) 1,000 unit cap   ??? glucosamine (GLUCOSAMINE RELIEF) 1,000 mg tab   ??? Omega-3 Fatty Acids (  FISH OIL) 300 mg cap   ??? PNV66-Iron Fumarate-FA-DSS-DHA 26-1.2-55-300 mg cap     PLAN:  Diagnosis explained in detail, including differential.  All questions answered.  Neurosurgeon distributed.  No orders of the defined types were placed in this encounter.      LEngthy discussion with pt re origin of acute pain- likely torsed pedunculated fibroid  Also discussion with above US findings and concern about abnormal adnexa as it pertains to her ability to conceive esp with Protein s def  Pt strongly desires pregnancy  Will refer to [facility] for surgical and medical management to allow for future pregnancy  PT taking PNV    Greater than 50% of the 45 minute visit were spent in counseling to the above topics.

## 2016-12-02 NOTE — Progress Notes (Signed)
GYN U/S DATED 12/02/16

## 2017-06-03 ENCOUNTER — Encounter

## 2017-06-17 ENCOUNTER — Ambulatory Visit

## 2017-06-17 ENCOUNTER — Inpatient Hospital Stay: Admit: 2017-06-17 | Payer: BLUE CROSS/BLUE SHIELD | Attending: Specialist | Primary: Family

## 2017-06-17 DIAGNOSIS — D251 Intramural leiomyoma of uterus: Secondary | ICD-10-CM

## 2017-06-17 MED ORDER — SALINE PERIPHERAL FLUSH PRN
Freq: Once | INTRAMUSCULAR | Status: AC
Start: 2017-06-17 — End: 2017-06-17
  Administered 2017-06-17: 13:00:00

## 2017-06-17 MED ORDER — GADOTERATE MEGLUMINE 0.5 MMOL/ML IV SOLUTION
0.5 mmol/mL (376.9 mg/mL) | Freq: Once | INTRAVENOUS | Status: AC
Start: 2017-06-17 — End: 2017-06-17
  Administered 2017-06-17: 13:00:00 via INTRAVENOUS

## 2018-01-18 ENCOUNTER — Emergency Department: Admit: 2018-01-18 | Payer: BLUE CROSS/BLUE SHIELD | Primary: Family

## 2018-01-18 ENCOUNTER — Inpatient Hospital Stay
Admit: 2018-01-18 | Discharge: 2018-01-18 | Disposition: A | Discharge status: Home / Self Care | Payer: BLUE CROSS/BLUE SHIELD | Attending: Emergency Medicine

## 2018-01-18 ENCOUNTER — Inpatient Hospital Stay: Attending: Emergency Medicine | Primary: Family

## 2018-01-18 DIAGNOSIS — S0990XA Unspecified injury of head, initial encounter: Secondary | ICD-10-CM

## 2018-01-18 MED ORDER — SODIUM CHLORIDE 0.9% BOLUS IV
0.9 % | Freq: Once | INTRAVENOUS | Status: AC
Start: 2018-01-18 — End: 2018-01-18
  Administered 2018-01-18: 22:00:00 via INTRAVENOUS

## 2018-01-18 NOTE — ED Notes (Signed)
Took bedside report from Dorothy Gomez, [place]. No needs at this time. Family at bedside.

## 2018-01-18 NOTE — ED Notes (Signed)
I have reviewed discharge instructions with the patient.  The patient verbalized understanding.    Patient left ED via Discharge Method: wheelchair to Home with friend.    Opportunity for questions and clarification provided.       Patient given 0 scripts.         To continue your aftercare when you leave the hospital, you may receive an automated call from our care team to check in on how you are doing.  This is a free service and part of our promise to provide the best care and service to meet your aftercare needs.??? If you have questions, or wish to unsubscribe from this service please call [phone].  Thank you for Choosing our [place] Emergency Department.

## 2018-01-18 NOTE — ED Provider Notes (Addendum)
At work and bent down raised up hitting the central superior for head on a sharp edge of furniture.  She states that she was knocked out.  She evidently fell to the floor but denies any injury related to this.  He is mentally alert at this point.  She has some dizziness at a point earlier in her ER arrival.vision or speech change.  No focal motor sensory changes. No fluid or blood nose or ears    The history is provided by the patient.   Head Injury    The injury mechanism was a direct blow. The volume of blood lost was minimal. The pain is mild. Pertinent negatives include no numbness, no vomiting, no tinnitus, no weakness and no memory loss. She has tried nothing for the symptoms. She lost consciousness for a period of less than one minute.        Past Medical History:   Diagnosis Date   ??? Autosomal dominant congenital non-nuclear cataract    ??? 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)    ??? Vitiligo        Past Surgical History:   Procedure Laterality Date   ??? HX GYN      D&C   ??? HX WISDOM TEETH EXTRACTION      x1 left bottom         Family History:   Problem Relation Age of Onset   ??? Celiac Disease Mother    ??? Diabetes Mother    ??? Hypertension Mother    ??? Hypertension Father    ??? No Known Problems Sister    ??? Asthma Brother    ??? Hypertension Maternal Grandmother    ??? Diabetes Maternal Grandmother    ??? No Known Problems Maternal Grandfather    ??? Breast Cancer Paternal Grandmother    ??? Diabetes Paternal Grandmother    ??? Hypertension Paternal Grandmother    ??? Heart Attack Paternal Grandfather        Social History     Socioeconomic History   ??? Marital status: MARRIED     Spouse name: Not on file   ??? Number of children: Not on file   ??? Years of education: Not on file   ??? Highest education level: Not on file   Occupational History   ??? Not on file   Social Needs   ??? Financial resource strain: Not on file   ??? Food insecurity:     Worry: Not on file     Inability: Not on file   ??? Transportation needs:      Medical: Not on file     Non-medical: Not on file   Tobacco Use   ??? Smoking status: Former Smoker     Last attempt to quit: 05/27/2015     Years since quitting: 2.6   ??? Smokeless tobacco: Never Used   Substance and Sexual Activity   ??? Alcohol use: Yes     Alcohol/week: 4.2 oz     Types: 3 Glasses of wine, 4 Shots of liquor per week   ??? Drug use: No   ??? Sexual activity: Yes     Partners: Male     Birth control/protection: None   Lifestyle   ??? Physical activity:     Days per week: Not on file     Minutes per session: Not on file   ??? Stress: Not on file   Relationships   ??? Social connections:     Talks on phone: Not  on file     Gets together: Not on file     Attends religious service: Not on file     Active member of club or organization: Not on file     Attends meetings of clubs or organizations: Not on file     Relationship status: Not on file   ??? Intimate partner violence:     Fear of current or ex partner: Not on file     Emotionally abused: Not on file     Physically abused: Not on file     Forced sexual activity: Not on file   Other Topics Concern   ??? Not on file   Social History Narrative   ??? Not on file         ALLERGIES: Patient has no known allergies.    Review of Systems   Constitutional: Negative for chills and fever.   HENT: Negative.  Negative for ear discharge, rhinorrhea and tinnitus.    Eyes: Negative for visual disturbance.   Respiratory: Negative.    Gastrointestinal: Negative for vomiting.   Musculoskeletal: Negative for neck pain.   Neurological: Negative for weakness and numbness.   Psychiatric/Behavioral: Negative for confusion and memory loss.   All other systems reviewed and are negative.      Vitals:    01/18/18 1612   BP: 110/68   Pulse: 84   Resp: 16   Temp: 98.6 ??F (37 ??C)   Weight: 81.6 kg (180 lb)   Height: 5\' 7"  (1.702 m)            Physical Exam   Constitutional: She appears well-developed and well-nourished. No distress.   HENT:   Head: Atraumatic.   Smiling and pleasant    Eyes: No scleral icterus.   Neck: Neck supple.   Cardiovascular: Normal rate, regular rhythm and normal heart sounds.   Pulmonary/Chest: Effort normal and breath sounds normal. No respiratory distress.   Abdominal: Soft.   Musculoskeletal: Normal range of motion.   Neurological: She is alert.   Skin: Skin is warm and dry.   Psychiatric: Thought content normal.   Nursing note and vitals reviewed.       MDM  Number of Diagnoses or Management Options  Injury of head, initial encounter:   Diagnosis management comments: Small abrasion at site of contact. CT benign alert and smiling. Present findings and imaging all good       Amount and/or Complexity of Data Reviewed  Tests in the radiology section of CPT??: reviewed  Independent visualization of images, tracings, or specimens: yes    Risk of Complications, Morbidity, and/or Mortality  Presenting problems: moderate  Diagnostic procedures: low  Management options: moderate    Patient Progress  Patient progress: stable         Procedures      Results   CT HEAD WO CONT (Accession [id number]) (Order [id number])   Allergies??     No Known Allergies   Result Information     Status: Final result (Exam End: 01/18/2018 17:02) Provider Status: Open   Signed by     Signed Date/Time  Phone Pager   Orson Aloe 01/18/2018 17:06 [phone]    Exam Information     Status Exam Begun  Exam Ended    Final [99] 01/18/2018 16:54 01/18/2018 17:02   Study Result     EXAM: CT head without contrast.  ADDITIONAL CLINICAL INFORMATION: None.  COMPARISON: None.  ??  Multiple axial images obtained through the brain  without intravenous contrast.   Radiation dose reduction techniques were used for this study:  All CT scans  performed at this facility use one or all of the following: Automated exposure  control, adjustment of the mA and/or kVp according to patient's size, iterative  reconstruction.  ??  FINDINGS:  Prominent left basal ganglia perivascular space. Apparent small contusion along   the midline of the frontal bone. No evidence of intracranial mass, hemorrhage,  or large territorial infarct. The ventricles are normal in size and position.  The basal cisterns are patent. No extra-axial fluid collection or mass effect.   ??  The orbital contents are within normal limits. The paranasal sinuses are clear.  The mastoid air cells and middle ears are clear.   ??  IMPRESSION  IMPRESSION:  1. No evidence of acute intracranial abnormality.

## 2018-01-18 NOTE — ED Triage Notes (Signed)
PMD-Dr Algis Liming. Pt bent down and as she raised up she hit her head on the corner of a table. Pt reports a positive LOC. C/o nausea in triage. Is able to answer all questions appropriately. Pupils are equal.

## 2018-01-18 NOTE — ED Notes (Signed)
Pt requested something to eat. Sandwich box and soft drink taken to her by [employer]

## 2018-01-27 ENCOUNTER — Encounter: Attending: Family | Primary: Family

## 2018-03-22 ENCOUNTER — Encounter: Attending: Family | Primary: Family

## 2018-04-04 ENCOUNTER — Encounter: Attending: Family | Primary: Family

## 2018-05-09 ENCOUNTER — Encounter: Attending: Family | Primary: Family

## 2018-10-04 ENCOUNTER — Emergency Department (HOSPITAL_COMMUNITY)
Admission: EM | Admit: 2018-10-04 | Discharge: 2018-10-05 | Disposition: A | Discharge status: Home / Self Care | Payer: 59 | Attending: Emergency Medicine | Admitting: Emergency Medicine

## 2018-10-04 ENCOUNTER — Other Ambulatory Visit: Payer: Self-pay

## 2018-10-04 ENCOUNTER — Encounter (HOSPITAL_COMMUNITY): Payer: Self-pay | Admitting: Emergency Medicine

## 2018-10-04 DIAGNOSIS — R002 Palpitations: Secondary | ICD-10-CM | POA: Diagnosis present

## 2018-10-04 DIAGNOSIS — R0602 Shortness of breath: Secondary | ICD-10-CM | POA: Diagnosis not present

## 2018-10-04 DIAGNOSIS — D649 Anemia, unspecified: Secondary | ICD-10-CM | POA: Insufficient documentation

## 2018-10-04 LAB — CBC
HCT: 31.7 % — ABNORMAL LOW (ref 36.0–46.0)
Hemoglobin: 9.3 g/dL — ABNORMAL LOW (ref 12.0–15.0)
MCH: 17.9 pg — ABNORMAL LOW (ref 26.0–34.0)
MCHC: 29.3 g/dL — ABNORMAL LOW (ref 30.0–36.0)
MCV: 61.1 fL — ABNORMAL LOW (ref 80.0–100.0)
PLATELETS: 202 10*3/uL (ref 150–400)
RBC: 5.19 MIL/uL — ABNORMAL HIGH (ref 3.87–5.11)
RDW: 19.2 % — ABNORMAL HIGH (ref 11.5–15.5)
WBC: 8.3 10*3/uL (ref 4.0–10.5)
nRBC: 0 % (ref 0.0–0.2)

## 2018-10-04 LAB — BASIC METABOLIC PANEL
Anion gap: 8 (ref 5–15)
BUN: 9 mg/dL (ref 6–20)
CO2: 22 mmol/L (ref 22–32)
Calcium: 9.1 mg/dL (ref 8.9–10.3)
Chloride: 107 mmol/L (ref 98–111)
Creatinine, Ser: 0.6 mg/dL (ref 0.44–1.00)
GFR calc Af Amer: >60 mL/min (ref >60)
GFR calc non Af Amer: >60 mL/min (ref >60)
Glucose, Bld: 98 mg/dL (ref 70–99)
Potassium: 3.8 mmol/L (ref 3.5–5.1)
Sodium: 137 mmol/L (ref 135–145)

## 2018-10-04 LAB — URINALYSIS, ROUTINE W REFLEX MICROSCOPIC
Bilirubin Urine: NEGATIVE
Glucose, UA: NEGATIVE mg/dL
Hgb urine dipstick: NEGATIVE
Ketones, ur: NEGATIVE mg/dL
Nitrite: NEGATIVE
PH: 6 (ref 5.0–8.0)
Protein, ur: NEGATIVE mg/dL
Specific Gravity, Urine: 1.004 — ABNORMAL LOW (ref 1.005–1.030)

## 2018-10-04 LAB — I-STAT BETA HCG BLOOD, ED (MC, WL, AP ONLY): I-stat hCG, quantitative: <5 m[IU]/mL (ref <5)

## 2018-10-04 NOTE — ED Triage Notes (Signed)
Pt reports she has not been sleeping well and has been having tremors since last week. Pt reports she feels like her heart is racing/jittery. Also reports weakness/dehydration. Denies N/V/D. Denies pain.

## 2018-10-05 LAB — CBG MONITORING, ED: Glucose-Capillary: 90 mg/dL (ref 70–99)

## 2018-10-05 NOTE — ED Provider Notes (Signed)
[facility] EMERGENCY DEPARTMENT Provider Note   CSN: [id number] Arrival date & time: 10/04/18  2218     History   Chief Complaint Chief Complaint  Patient presents with  . Dehydration  . Palpitations    HPI Tina Melendez is a 37 y.o. female.  HPI  37 year old female comes in with chief complaint of palpitations. Patient does not have any significant medical history and is not taking any medications besides over-the-counter vitamins.  She reports that over the past 2 to 3 days she has had palpitations that she can appreciate at nighttime when she is laying flat.  Patient will wake up in the middle of her night because of her palpitations feeling short of breath, and then she is unable to sleep for several hours.  Her symptoms are mostly nighttime, however on a couple of occasions she did have daytime palpitations that lasted just for a few seconds.  Patient denies any associated chest pain, shortness of breath, dizziness, near syncope/syncope.   She denies any heavy caffeine use, energy drink use, over-the-counter supplements besides the vitamins. Pt has no hx of PE, DVT and denies any exogenous hormone (testosterone / estrogen) use, long distance travels or surgery in the past 6 weeks, active cancer, recent immobilization.   History reviewed. No pertinent past medical history.  There are no active problems to display for this patient.   History reviewed. No pertinent surgical history.   OB History   None      Home Medications    Prior to Admission medications   Not on File    Family History No family history on file.  Social History Social History   Tobacco Use  . Smoking status: Never Smoker  . Smokeless tobacco: Never Used  Substance Use Topics  . Alcohol use: Yes    Comment: occ  . Drug use: Not on file     Allergies   Patient has no known allergies.   Review of Systems Review of Systems  Constitutional: Positive for  activity change.  Respiratory: Positive for shortness of breath.   Cardiovascular: Positive for palpitations. Negative for chest pain.  Neurological: Negative for dizziness.  All other systems reviewed and are negative.    Physical Exam Updated Vital Signs BP (!) 124/101   Pulse (!) 58   Temp 97.9 F (36.6 C) (Oral)   Resp 13   Ht 5\' 7"  (1.702 m)   Wt 86.2 kg   LMP 09/24/2018   SpO2 99%   BMI 29.76 kg/m   Physical Exam  Constitutional: She is oriented to person, place, and time. She appears well-developed.  HENT:  Head: Normocephalic and atraumatic.  Eyes: EOM are normal.  Neck: Normal range of motion. Neck supple.  Cardiovascular: Normal rate and intact distal pulses.  Pulmonary/Chest: Effort normal.  Abdominal: Bowel sounds are normal.  Musculoskeletal: She exhibits no edema or tenderness.  Neurological: She is alert and oriented to person, place, and time.  Skin: Skin is warm and dry.  Nursing note and vitals reviewed.    ED Treatments / Results  Labs (all labs ordered are listed, but only abnormal results are displayed) Labs Reviewed  CBC - Abnormal; Notable for the following components:      Result Value   RBC 5.19 (*)    Hemoglobin 9.3 (*)    HCT 31.7 (*)    MCV 61.1 (*)    MCH 17.9 (*)    MCHC 29.3 (*)    RDW  19.2 (*)    All other components within normal limits  URINALYSIS, ROUTINE W REFLEX MICROSCOPIC - Abnormal; Notable for the following components:   Color, Urine COLORLESS (*)    APPearance HAZY (*)    Specific Gravity, Urine 1.004 (*)    Leukocytes, UA SMALL (*)    Bacteria, UA RARE (*)    All other components within normal limits  BASIC METABOLIC PANEL  CBG MONITORING, ED  I-STAT BETA HCG BLOOD, ED ([facility], [facility], [facility] ONLY)    EKG EKG Interpretation  Date/Time:  Tuesday October 04 2018 23:01:21 EST Ventricular Rate:  74 PR Interval:  166 QRS Duration: 78 QT Interval:  396 QTC Calculation: 439 R Axis:   16 Text Interpretation:  Normal  sinus rhythm Cannot rule out Anterior infarct , age undetermined Abnormal ECG No acute changes No significant change since last tracing Confirmed by Varney Biles [phone]) on 10/05/2018 2:55:54 AM   Radiology No results found.  Procedures Procedures (including critical care time)  Medications Ordered in ED Medications - No data to display   Initial Impression / Assessment and Plan / ED Course  I have reviewed the triage vital signs and the nursing notes.  Pertinent labs & imaging results that were available during my care of the patient were reviewed by me and considered in my medical decision making (see chart for details).  Clinical Course as of Oct 05 333  Wed Oct 05, 2018  0334 Patient is noted to have mild anemia with hemoglobin of 9.1.  I do not think her symptoms are because of this anemia.  She does not have a PCP as she is new to town therefore we will request her to follow-up with cardiology as her telemetry monitoring in the ED did not reveal any abnormality.  Patient also states that she had no episode of palpitations while in the ER, therefore we think she might benefit with Holter monitoring if her symptoms persist. Results from the ED work-up discussed with the patient and she is comfortable with the plan.   [AN]    Clinical Course User Index [AN] Varney Biles, MD    37 year old female comes in a chief complaint of palpitations.  It appears that her symptoms typically are provoked at nighttime when she is laying supine.  There is no associated cardiac prodrome.  Patient's medical, social and family history are unremarkable. We will get basic labs and monitor patient on telemetry monitoring.  Final Clinical Impressions(s) / ED Diagnoses   Final diagnoses:  Palpitations    ED Discharge Orders    None       Varney Biles, MD 10/05/18 [phone]

## 2018-10-05 NOTE — Discharge Instructions (Addendum)
We saw in the ER for palpitations. Work-up in the ED did not reveal any abnormalities -however, you did not have any episode of palpitation while being in the ER.  We are not sure what is causing her symptoms.  If you notice that the symptoms are persistent then it might be worthwhile following up with cardiology or primary care doctor for cardiac monitoring device.

## 2018-10-07 ENCOUNTER — Other Ambulatory Visit: Payer: Self-pay

## 2018-10-07 ENCOUNTER — Encounter (HOSPITAL_COMMUNITY): Payer: Self-pay

## 2018-10-07 ENCOUNTER — Emergency Department (HOSPITAL_COMMUNITY)
Admission: EM | Admit: 2018-10-07 | Discharge: 2018-10-08 | Disposition: A | Discharge status: Home / Self Care | Payer: 59 | Attending: Emergency Medicine | Admitting: Emergency Medicine

## 2018-10-07 DIAGNOSIS — G4700 Insomnia, unspecified: Secondary | ICD-10-CM

## 2018-10-07 NOTE — ED Triage Notes (Signed)
Pt reports continued trouble sleeping. She states that she has been unable to sleep well in 2 weeks. She reports a lot of "transitions" in her life that are causing her stress and anxiety. A&Ox4. Denies SI/HI. Denies daily medications.

## 2018-10-08 NOTE — Discharge Instructions (Addendum)
Call this urgent care for comprehensive primary care evaluation.  Address: [address] [place], [place], [place] [address] Hours:  Saturday 8AM-2PM Sunday Closed Monday 8AM-6PM Tuesday 8AM-6PM Wednesday 8AM-6PM Thursday 8AM-6PM Friday 8AM-6PM Suggest an edit   Phone: [phone]

## 2018-10-08 NOTE — ED Provider Notes (Signed)
[facility] DEPT Provider Note   CSN: [id number] Arrival date & time: 10/07/18  2203     History   Chief Complaint Chief Complaint  Patient presents with  . Insomnia    HPI Tina Melendez is a 37 y.o. female.  HPI 37 year old with no medical history comes in because of persistent insomnia.  Patient was seen by me about 3 nights ago.  She reports that she continues to have insomnia and is unable to sleep well.  She has not yet contacted primary care doctor or cardiology as per our request.  Patient denies any underlying medical or psychiatric history.  She denies any hyperactivity or engagement in high risk behavior.  There is no family history of bipolar disorder.  Social history is benign as patient continues to deny any stimulant use, diet pills, increased caffeine intake or drug use.  She states that normally she wakes up because of palpitations.  She is able to fall asleep, but her sleep is broken up every 1-3 hours.  She has been able to work.  She does indicate that she transition to her new role in [place] city 2 months ago, but besides that there is no new stressors.  History reviewed. No pertinent past medical history.  There are no active problems to display for this patient.   History reviewed. No pertinent surgical history.   OB History   No obstetric history on file.      Home Medications    Prior to Admission medications   Not on File    Family History History reviewed. No pertinent family history.  Social History Social History   Tobacco Use  . Smoking status: Never Smoker  . Smokeless tobacco: Never Used  Substance Use Topics  . Alcohol use: Yes    Comment: occ  . Drug use: Not on file     Allergies   Patient has no known allergies.   Review of Systems Review of Systems  Constitutional: Positive for activity change.  Cardiovascular: Positive for palpitations.  Psychiatric/Behavioral: Positive for sleep  disturbance.     Physical Exam Updated Vital Signs BP 134/90 (BP Location: Left Arm)   Pulse 69   Temp 98.1 F (36.7 C) (Oral)   Resp 16   Ht 5\' 7"  (1.702 m)   Wt 86.2 kg   LMP 09/24/2018   SpO2 98%   BMI 29.76 kg/m   Physical Exam Vitals signs and nursing note reviewed.  Constitutional:      Appearance: She is well-developed.  Neck:     Musculoskeletal: Normal range of motion and neck supple.  Cardiovascular:     Rate and Rhythm: Normal rate.  Pulmonary:     Effort: Pulmonary effort is normal.  Abdominal:     General: Bowel sounds are normal.  Skin:    General: Skin is warm.  Neurological:     General: No focal deficit present.     Mental Status: She is alert and oriented to person, place, and time.      ED Treatments / Results  Labs (all labs ordered are listed, but only abnormal results are displayed) Labs Reviewed - No data to display  EKG None  Radiology No results found.  Procedures Procedures (including critical care time)  Medications Ordered in ED Medications - No data to display   Initial Impression / Assessment and Plan / ED Course  I have reviewed the triage vital signs and the nursing notes.  Pertinent labs &  imaging results that were available during my care of the patient were reviewed by me and considered in my medical decision making (see chart for details).     37 year old comes in a chief complaint of insomnia and difficulty sleeping.  She is also having palpitations, which leads to her waking up. She was seen in the ER by me 3 days ago.  Lab work at that time was normal, EKG was fine and telemetry monitoring was normal.  Patient has not called PCP or cardiology for follow-up.  She denies taking any stimulants she has not tried any over-the-counter sleep aides.  I informed her that the role of emergency department will be limited.  There are multiple causes that can cause people to have sleep disturbance, some of it could be  medical, others could be psychogenic-and I have given the phone number for recently opened new primary care-urgent care.   Final Clinical Impressions(s) / ED Diagnoses   Final diagnoses:  Insomnia, unspecified type    ED Discharge Orders    None       Varney Biles, MD 10/08/18 [phone]

## 2018-10-24 ENCOUNTER — Telehealth: Payer: Self-pay | Admitting: Internal Medicine

## 2018-10-24 ENCOUNTER — Encounter: Payer: Self-pay | Admitting: Internal Medicine

## 2018-10-24 NOTE — Telephone Encounter (Signed)
Pt has been scheduled to see Dr. Walden Field on 1/23 ay 950am. Pt aware to arrive 30 minutes early. Letter mailed.

## 2018-10-27 ENCOUNTER — Ambulatory Visit: Payer: Self-pay | Admitting: *Deleted

## 2018-10-27 NOTE — Telephone Encounter (Signed)
Patient has been experiencing heart racing sensations since early December. Stated they have elevated to twice a day at times and especially at night when she goes to bed.Heartbeat at this time is 77 bpm. She does not have access to b/p cuff at this time. Reports her chest gets tight and feels somewhat SOB with lying down when this occurs. This feeling comes and goes and usually last from seconds to minutes then resolves. She denies pain radiating. She does question if this could be panic attacks. She and her family live with another family and are preparing to move out within 1-2 weeks. She has been seen in ER and UC for this, several times in the last month. Reports no findings during those visits except mild anemia. She has an appointment with Cardiology tomorrow. Urged to keep this appointment. Has a new patient appointment on 11/08/18 with Dr. Volanda Napoleon. Referred her to ED if symptoms reoccur/worsen. Stated she understood.   Reason for Disposition . Palpitations are a chronic symptom (recurrent or ongoing AND present > 4 weeks)  Answer Assessment - Initial Assessment Questions 1. DESCRIPTION: "Please describe your heart rate or heart beat that you are having" (e.g., fast/slow, regular/irregular, skipped or extra beats, "palpitations")     Heart racing with exertion and lying down at night. 2. ONSET: "When did it start?" (Minutes, hours or days)      Early December 3. DURATION: "How long does it last" (e.g., seconds, minutes, hours)     Minutes sometimes hours to resolve 4. PATTERN "Does it come and go, or has it been constant since it started?"  "Does it get worse with exertion?"   "Are you feeling it now?"     Comes and goes. 5. TAP: "Using your hand, can you tap out what you are feeling on a chair or table in front of you, so that I can hear?" (Note: not all patients can do this)       Using apple wsatch 6. HEART RATE: "Can you tell me your heart rate?" "How many beats in 15 seconds?"  (Note: not  all patients can do this)       77 bpm 7. RECURRENT SYMPTOM: "Have you ever had this before?" If so, ask: "When was the last time?" and "What happened that time?"      Yes since early December. 8. CAUSE: "What do you think is causing the palpitations?"     Unsure 9. CARDIAC HISTORY: "Do you have any history of heart disease?" (e.g., heart attack, angina, bypass surgery, angioplasty, arrhythmia)      none 10. OTHER SYMPTOMS: "Do you have any other symptoms?" (e.g., dizziness, chest pain, sweating, difficulty breathing)       Chest gets tight and feels short of breath when this occurs.  11. PREGNANCY: "Is there any chance you are pregnant?" "When was your last menstrual period?"       no  Protocols used: HEART RATE AND HEARTBEAT QUESTIONS-A-AH

## 2018-10-28 ENCOUNTER — Ambulatory Visit: Payer: 59 | Admitting: Cardiovascular Disease

## 2018-10-28 ENCOUNTER — Other Ambulatory Visit: Payer: Self-pay

## 2018-10-28 ENCOUNTER — Encounter (HOSPITAL_COMMUNITY): Payer: Self-pay

## 2018-10-28 DIAGNOSIS — R0789 Other chest pain: Secondary | ICD-10-CM | POA: Insufficient documentation

## 2018-10-28 DIAGNOSIS — R531 Weakness: Secondary | ICD-10-CM | POA: Diagnosis present

## 2018-10-28 DIAGNOSIS — Z79899 Other long term (current) drug therapy: Secondary | ICD-10-CM | POA: Diagnosis not present

## 2018-10-28 NOTE — ED Triage Notes (Addendum)
Pt reports fatigue and malaise that started before Christmas. Endorses cough tonight. Also reports sharp centralized chest pain that started around 930. Pt took 324 of aspirin and 1000 of tylenol PTA. Fever noted with EMS.

## 2018-10-29 ENCOUNTER — Emergency Department (HOSPITAL_COMMUNITY)
Admission: EM | Admit: 2018-10-29 | Discharge: 2018-10-29 | Disposition: A | Discharge status: Home / Self Care | Payer: 59 | Attending: Emergency Medicine | Admitting: Emergency Medicine

## 2018-10-29 ENCOUNTER — Emergency Department (HOSPITAL_COMMUNITY): Payer: 59

## 2018-10-29 ENCOUNTER — Encounter (HOSPITAL_COMMUNITY): Payer: Self-pay | Admitting: Emergency Medicine

## 2018-10-29 DIAGNOSIS — R079 Chest pain, unspecified: Secondary | ICD-10-CM

## 2018-10-29 LAB — COMPREHENSIVE METABOLIC PANEL
ALT: 13 U/L (ref 0–44)
AST: 15 U/L (ref 15–41)
Albumin: 4 g/dL (ref 3.5–5.0)
Alkaline Phosphatase: 46 U/L (ref 38–126)
Anion gap: 10 (ref 5–15)
BUN: 8 mg/dL (ref 6–20)
CO2: 22 mmol/L (ref 22–32)
Calcium: 8.9 mg/dL (ref 8.9–10.3)
Chloride: 107 mmol/L (ref 98–111)
Creatinine, Ser: 0.61 mg/dL (ref 0.44–1.00)
GFR calc Af Amer: >60 mL/min (ref >60)
GFR calc non Af Amer: >60 mL/min (ref >60)
Glucose, Bld: 98 mg/dL (ref 70–99)
Potassium: 4 mmol/L (ref 3.5–5.1)
Sodium: 139 mmol/L (ref 135–145)
Total Bilirubin: 0.3 mg/dL (ref 0.3–1.2)
Total Protein: 6.8 g/dL (ref 6.5–8.1)

## 2018-10-29 LAB — URINALYSIS, ROUTINE W REFLEX MICROSCOPIC
Bilirubin Urine: NEGATIVE
Glucose, UA: NEGATIVE mg/dL
Hgb urine dipstick: NEGATIVE
Ketones, ur: NEGATIVE mg/dL
Nitrite: NEGATIVE
Protein, ur: NEGATIVE mg/dL
Specific Gravity, Urine: 1.015 (ref 1.005–1.030)
pH: 6 (ref 5.0–8.0)

## 2018-10-29 LAB — URINALYSIS, MICROSCOPIC (REFLEX): RBC / HPF: NONE SEEN RBC/hpf (ref 0–5)

## 2018-10-29 LAB — CBC WITH DIFFERENTIAL/PLATELET
Abs Immature Granulocytes: 0.02 10*3/uL (ref 0.00–0.07)
Basophils Absolute: 0.1 10*3/uL (ref 0.0–0.1)
Basophils Relative: 1 %
Eosinophils Absolute: 0.5 10*3/uL (ref 0.0–0.5)
Eosinophils Relative: 7 %
HCT: 33.1 % — ABNORMAL LOW (ref 36.0–46.0)
Hemoglobin: 9.8 g/dL — ABNORMAL LOW (ref 12.0–15.0)
Immature Granulocytes: 0 %
Lymphocytes Relative: 30 %
Lymphs Abs: 1.9 10*3/uL (ref 0.7–4.0)
MCH: 19.3 pg — ABNORMAL LOW (ref 26.0–34.0)
MCHC: 29.6 g/dL — ABNORMAL LOW (ref 30.0–36.0)
MCV: 65.2 fL — ABNORMAL LOW (ref 80.0–100.0)
Monocytes Absolute: 0.7 10*3/uL (ref 0.1–1.0)
Monocytes Relative: 10 %
Neutro Abs: 3.3 10*3/uL (ref 1.7–7.7)
Neutrophils Relative %: 52 %
Platelets: 215 10*3/uL (ref 150–400)
RBC: 5.08 MIL/uL (ref 3.87–5.11)
RDW: 23 % — ABNORMAL HIGH (ref 11.5–15.5)
WBC: 6.4 10*3/uL (ref 4.0–10.5)
nRBC: 0 % (ref 0.0–0.2)

## 2018-10-29 LAB — I-STAT BETA HCG BLOOD, ED (MC, WL, AP ONLY): I-stat hCG, quantitative: <5 m[IU]/mL (ref <5)

## 2018-10-29 LAB — TSH: TSH: 1.282 u[IU]/mL (ref 0.350–4.500)

## 2018-10-29 LAB — I-STAT CG4 LACTIC ACID, ED: Lactic Acid, Venous: 0.66 mmol/L (ref 0.5–1.9)

## 2018-10-29 NOTE — ED Notes (Signed)
Bed: WA17 Expected date:  Expected time:  Means of arrival:  Comments: RES A 

## 2018-10-29 NOTE — ED Provider Notes (Signed)
[facility] DEPT Provider Note   CSN: [id number] Arrival date & time: 10/28/18  2230     History   Chief Complaint Chief Complaint  Patient presents with  . Fatigue  . Fever    HPI Tina Melendez is a 38 y.o. female.  HPI   38 year old female with multiple complaints.  Symptoms began around Christmas.  Says she felt generally weak.  Feels like she has no energy.  Today she developed sharp pain in her chest.  Substernal.  Associated with a cough.  Pain is worse with cough.  Subjective fevers.  No unusual leg pain or swelling.  No sick contacts.  Received 324 mg of aspirin at thousand milligrams of Tylenol prior to arrival.  Feels mildly improved.  History reviewed. No pertinent past medical history.  There are no active problems to display for this patient.   History reviewed. No pertinent surgical history.   OB History   No obstetric history on file.      Home Medications    Prior to Admission medications   Medication Sig Start Date End Date Taking? Authorizing Provider  acidophilus (RISAQUAD) CAPS capsule Take 2 capsules by mouth daily.   Yes [provider]  ferrous sulfate 325 (65 FE) MG tablet Take 325 mg by mouth daily with breakfast.   Yes [provider]  magnesium oxide (MAG-OX) 400 MG tablet Take 400 mg by mouth daily.   Yes [provider]  Multiple Vitamin (MULTIVITAMIN WITH MINERALS) TABS tablet Take 1 tablet by mouth daily.   Yes [provider]  nitrofurantoin, macrocrystal-monohydrate, (MACROBID) 100 MG capsule Take 100 mg by mouth 2 (two) times daily.    [provider]    Family History History reviewed. No pertinent family history.  Social History Social History   Tobacco Use  . Smoking status: Never Smoker  . Smokeless tobacco: Never Used  Substance Use Topics  . Alcohol use: Yes    Comment: occ  . Drug use: Never     Allergies   Patient has no known  allergies.   Review of Systems Review of Systems  All systems reviewed and negative, other than as noted in HPI.  Physical Exam Updated Vital Signs BP 117/74 (BP Location: Left Arm)   Pulse 61   Temp 98.7 F (37.1 C) (Oral)   Resp 14   Ht 5\' 7"  (1.702 m)   Wt 80.7 kg   LMP 10/24/2018   SpO2 100%   BMI 27.88 kg/m   Physical Exam Vitals signs and nursing note reviewed.  Constitutional:      General: She is not in acute distress.    Appearance: She is well-developed.  HENT:     Head: Normocephalic and atraumatic.  Eyes:     General:        Right eye: No discharge.        Left eye: No discharge.     Conjunctiva/sclera: Conjunctivae normal.  Neck:     Musculoskeletal: Neck supple.  Cardiovascular:     Rate and Rhythm: Normal rate and regular rhythm.     Heart sounds: Normal heart sounds. No murmur. No friction rub. No gallop.   Pulmonary:     Effort: Pulmonary effort is normal. No respiratory distress.     Breath sounds: Normal breath sounds.  Abdominal:     General: There is no distension.     Palpations: Abdomen is soft.     Tenderness: There is no abdominal  tenderness.  Musculoskeletal:        General: No tenderness.  Skin:    General: Skin is warm and dry.  Neurological:     Mental Status: She is alert.  Psychiatric:        Behavior: Behavior normal.        Thought Content: Thought content normal.      ED Treatments / Results  Labs (all labs ordered are listed, but only abnormal results are displayed) Labs Reviewed  CBC WITH DIFFERENTIAL/PLATELET - Abnormal; Notable for the following components:      Result Value   Hemoglobin 9.8 (*)    HCT 33.1 (*)    MCV 65.2 (*)    MCH 19.3 (*)    MCHC 29.6 (*)    RDW 23.0 (*)    All other components within normal limits  COMPREHENSIVE METABOLIC PANEL  TSH  URINALYSIS, ROUTINE W REFLEX MICROSCOPIC  I-STAT CG4 LACTIC ACID, ED  I-STAT BETA HCG BLOOD, ED ([facility], [facility], [facility] ONLY)  I-STAT CG4 LACTIC ACID, ED     EKG EKG Interpretation  Date/Time:  Saturday October 29 2018 06:36:58 EST Ventricular Rate:  62 PR Interval:    QRS Duration: 100 QT Interval:  414 QTC Calculation: 421 R Axis:   -10 Text Interpretation:  Sinus rhythm Confirmed by Virgel Manifold [phone]) on 10/29/2018 6:40:36 AM   Radiology Dg Chest 2 View  Result Date: 10/29/2018 CLINICAL DATA:  Acute onset of generalized fatigue and malaise. Cough. Central chest pain. Fever. EXAM: CHEST - 2 VIEW COMPARISON:  None. FINDINGS: The lungs are well-aerated and clear. There is no evidence of focal opacification, pleural effusion or pneumothorax. The heart is normal in size; the mediastinal contour is within normal limits. No acute osseous abnormalities are seen. IMPRESSION: No acute cardiopulmonary process seen. Electronically Signed   By: Garald Balding M.D.   On: 10/29/2018 05:24    Procedures Procedures (including critical care time)  Medications Ordered in ED Medications - No data to display   Initial Impression / Assessment and Plan / ED Course  I have reviewed the triage vital signs and the nursing notes.  Pertinent labs & imaging results that were available during my care of the patient were reviewed by me and considered in my medical decision making (see chart for details).     53yF with CP with seems atypical for ACS and w/u fairly unremarkable. Multiple other vague complaints (fatigue, "don't feel well," insomnia, palpitations, etc) which she has been seen for numerous times within in the past month. EKG unchanged. CXR clear. nonfocal exam. Anemia stable. TSH normal. . Possible anxiety component. Currently staying with family while in process of moving. Regardless, I doubt emergent process. Outpt FU.   Final Clinical Impressions(s) / ED Diagnoses   Final diagnoses:  Chest pain, unspecified type    ED Discharge Orders    None       Virgel Manifold, MD 11/10/18 [phone]

## 2018-10-31 ENCOUNTER — Ambulatory Visit: Payer: Self-pay | Admitting: *Deleted

## 2018-10-31 NOTE — Telephone Encounter (Signed)
As pt has never been seen in this office we cannot give any medical advice. She will need to discuss with provider at NP visit on 11/02/18.   LMTCB to advise

## 2018-10-31 NOTE — Telephone Encounter (Signed)
Patient would like to know can she take Melatonin at bedtime? Patient states she was in the emergency room on January 4th for chest pain but it was ruled out and everything came back normal and thinks that it is anxiety related.    Patient was called regarding the use of Melatonin. Per references it is usually safe to take. She has not been sleeping well since having to relocate. And she was seen in the ED for chest pain recently.  Recommended that she starts with the lowest dosage.  Pt is afraid that she may have palpitations while she is sleeping. Recommended that the patient speak with her pharmacist regarding the dosage of Melatonin that she should start with. Her next appointment with her provider is on 11/02/18. Advised to call back for any concerns.  Reason for Disposition . Caller has medication question about med not prescribed by PCP and triager unable to answer question (e.g., compatibility with other med, storage)  Answer Assessment - Initial Assessment Questions 1. REASON FOR CALL or QUESTION: "What is your reason for calling today?" or "How can I best help you?" or "What question do you have that I can help answer?"     Question regarding Melatonin.  Protocols used: MEDICATION QUESTION CALL-A-AH, INFORMATION ONLY CALL-A-AH

## 2018-11-02 ENCOUNTER — Telehealth: Payer: Self-pay | Admitting: Family Medicine

## 2018-11-02 ENCOUNTER — Ambulatory Visit (INDEPENDENT_AMBULATORY_CARE_PROVIDER_SITE_OTHER): Payer: 59 | Admitting: Family Medicine

## 2018-11-02 ENCOUNTER — Encounter: Payer: Self-pay | Admitting: Family Medicine

## 2018-11-02 VITALS — BP 100/62 | HR 95 | Temp 98.5°F | Ht 67.0 in | Wt 174.9 lb

## 2018-11-02 DIAGNOSIS — D649 Anemia, unspecified: Secondary | ICD-10-CM | POA: Diagnosis not present

## 2018-11-02 DIAGNOSIS — D509 Iron deficiency anemia, unspecified: Secondary | ICD-10-CM | POA: Insufficient documentation

## 2018-11-02 DIAGNOSIS — D219 Benign neoplasm of connective and other soft tissue, unspecified: Secondary | ICD-10-CM | POA: Diagnosis not present

## 2018-11-02 DIAGNOSIS — R002 Palpitations: Secondary | ICD-10-CM | POA: Diagnosis not present

## 2018-11-02 MED ORDER — FERROUS SULFATE 325 (65 FE) MG PO TBEC: 325.0000 mg | DELAYED_RELEASE_TABLET | Freq: Two times a day (BID) | ORAL | 2 refills | Status: DC

## 2018-11-02 NOTE — Telephone Encounter (Signed)
Patient called to office and says [facility] did not receive the Ferrous Sulfate and she's there at [facility]. Medication resent to [facility] electronically.

## 2018-11-02 NOTE — Progress Notes (Signed)
Tina Melendez DOB: 1981-06-28 Encounter date: 11/02/2018  This isa 38 y.o. female who presents to establish care. Chief Complaint  Patient presents with  . New Patient (Initial Visit)    History of present illness: Just moved to [facility] in August but hasn't established yet. Moved from [place].   Beginning of Dec just couldn't sleep. Palpitations every time she laid down. Exhausted because of difficulty with sleeping. Just kept happening. Scared because she doesn't know what is going on, sleep is out of whack. Melatonin made her dizzy. She has had some dizziness anyway with these symptoms. Has been to ER twice for evaluation.  Still having palpitations, shortness of breath. Happening on daily basis. Was told by one doc it was all due to anemia. No appetite, forcing self to eat.   HR is up in evening because she is scared, worried about night.   Hyperventilating, panic attacks, very anxious.   On Mg TID x 10 days which is helping in that palpitations have lessened. Trying to eat beet, taking iron (26mg ) spinach/kale.   EKG's have been fine. Oxygen has been fine.   Has been anemic in past, but not had symptoms with this in the past. Diet hasn't been the greatest; increased stress in last 6 months. Has been living with other people (but moving into home with spouse this week)   OBGYN appt Friday at [facility]: menstrual cycle is heavy; always has been. Periods are monthly. Typically bleeds 5 days. On heavy days has to change at least q 2 hours. Blood clots, smaller, with menses. Has 2 heavy days/cycle other days are lighter needed changes q 4 hours.  Has appointment with hematology and with cardiology this month.   Heart rate baseline 70-80's, but then gets over 100 when feeling palpitations (115 is highest she has seen this)  Past Medical History:  Diagnosis Date  . Anemia   . Fibroid uterus    Past Surgical History:  Procedure Laterality Date  . DILATION AND  CURETTAGE OF UTERUS     miscarriage   No Known Allergies Current Meds  Medication Sig  . magnesium oxide (MAG-OX) 400 MG tablet Take 400 mg by mouth daily.  . Multiple Vitamin (MULTIVITAMIN WITH MINERALS) TABS tablet Take 1 tablet by mouth daily.   Social History   Tobacco Use  . Smoking status: Never Smoker  . Smokeless tobacco: Never Used  Substance Use Topics  . Alcohol use: Not Currently    Comment: occ   Family History  Problem Relation Age of Onset  . Alcohol abuse Mother   . Arthritis Mother   . Arthritis Sister   . Alcohol abuse Brother   . Asthma Brother   . Sickle cell anemia Maternal Grandmother   . Breast cancer Paternal Grandmother      Review of Systems  Constitutional: Positive for appetite change, fatigue and unexpected weight change (weight loss with loss of appetite). Negative for chills and fever.  HENT: Positive for sinus pressure (right side of face). Negative for congestion, ear pain, postnasal drip and sore throat.   Respiratory: Negative for cough, chest tightness, shortness of breath and wheezing.   Cardiovascular: Negative for chest pain, palpitations and leg swelling.  Gastrointestinal: Positive for diarrhea (started with magnesium). Negative for abdominal pain, blood in stool and constipation.  Neurological: Positive for dizziness.    Objective:  BP 100/62 (BP Location: Left Arm, Patient Position: Sitting, Cuff Size: Normal)   Pulse 95   Temp 98.5  F (36.9 C) (Oral)   Ht 5\' 7"  (1.702 m)   Wt 174 lb 14.4 oz (79.3 kg)   LMP 10/24/2018   SpO2 95%   BMI 27.39 kg/m   Weight: 174 lb 14.4 oz (79.3 kg)   BP Readings from Last 3 Encounters:  11/02/18 100/62  10/29/18 116/87  10/08/18 134/90   Wt Readings from Last 3 Encounters:  11/02/18 174 lb 14.4 oz (79.3 kg)  10/29/18 178 lb (80.7 kg)  10/07/18 190 lb (86.2 kg)    Physical Exam Constitutional:      General: She is not in acute distress.    Appearance: She is well-developed.   HENT:     Head:     Comments: Central tongue - brownish hyperpigmentation, non-discrete border    Mouth/Throat:     Mouth: Mucous membranes are moist.  Cardiovascular:     Rate and Rhythm: Normal rate and regular rhythm.     Heart sounds: Normal heart sounds. No murmur. No friction rub.     Comments: No lower extremity edema Pulmonary:     Effort: Pulmonary effort is normal. No respiratory distress.     Breath sounds: Normal breath sounds. No wheezing or rales.  Abdominal:     General: Abdomen is flat. Bowel sounds are normal. There is no distension.  Neurological:     Mental Status: She is alert and oriented to person, place, and time.  Psychiatric:        Behavior: Behavior normal.     Assessment/Plan:  1. Palpitations Has follow up with cardiology; will order monitor in meanwhile. With daily sx should be able to evaluate during episode. Electrolytes, thyroid have been normal. Troponin negative and EKG normal from ER visit ([place] and then at [facility].  - Holter monitor - 48 hour; Future - Magnesium, RBC  2. Anemia, unspecified type Will check iron levels. Last CBC was 4 days ago and showed slight decline from one 2 weeks prior. If ferritin levels very low will see about getting earlier hematology appointment for anemia. Has appt with obgyn Friday and can discuss heavy menses at that time. Denies blood in stool, but pending obgyn evaluation could consider further GI eval to rule out.  Suspect weight loss is related to anxiety/stress. Encouraged at least keeping up with fluid intake (also to help with blood pressure, dizziness). Will continue to monitor.   - Ferritin; Future - Iron and TIBC; Future - Iron and TIBC - Ferritin  3. Anxiety: we discussed briefly treatment options. Would like to avoid controlled short acting medications (benzodiazepines). Suspect as she gets treatment and answers she will feel better. She also will be moving out of current living situation  this week which she feels will help. We discussed trial of benadryl at bedtime (has tolerated in past and may help fall asleep and stay asleep better) or prescription vistaril she has some concerns about sedating side effects. If anxiety not improving could certainly consider SSRI, buspar. At this point bp is low so beta blocker may not be good option although could consider to help with palpitations and anxiety in future.  Return pending lab results.  Micheline Rough, MD

## 2018-11-03 LAB — FERRITIN: Ferritin: 9.2 ng/mL — ABNORMAL LOW (ref 10.0–291.0)

## 2018-11-04 LAB — IRON, TOTAL/TOTAL IRON BINDING CAP
%SAT: 38 % (calc) (ref 16–45)
Iron: 140 ug/dL (ref 40–190)
TIBC: 366 mcg/dL (calc) (ref 250–450)

## 2018-11-04 LAB — MAGNESIUM, RBC: Magnesium RBC: 4.6 mg/dL (ref 4.0–6.4)

## 2018-11-07 ENCOUNTER — Ambulatory Visit: Payer: Self-pay | Admitting: *Deleted

## 2018-11-07 ENCOUNTER — Ambulatory Visit (INDEPENDENT_AMBULATORY_CARE_PROVIDER_SITE_OTHER): Payer: 59 | Admitting: Family Medicine

## 2018-11-07 ENCOUNTER — Other Ambulatory Visit: Payer: Self-pay

## 2018-11-07 ENCOUNTER — Encounter: Payer: Self-pay | Admitting: Family Medicine

## 2018-11-07 VITALS — BP 120/74 | HR 92 | Temp 98.3°F | Ht 67.0 in | Wt 173.9 lb

## 2018-11-07 DIAGNOSIS — B9789 Other viral agents as the cause of diseases classified elsewhere: Secondary | ICD-10-CM

## 2018-11-07 DIAGNOSIS — J069 Acute upper respiratory infection, unspecified: Secondary | ICD-10-CM

## 2018-11-07 MED ORDER — BENZONATATE 200 MG PO CAPS: 200.0000 mg | ORAL_CAPSULE | Freq: Three times a day (TID) | ORAL | 0 refills | Status: DC | PRN

## 2018-11-07 NOTE — Telephone Encounter (Signed)
Will send to [facility] as Juluis Rainier

## 2018-11-07 NOTE — Telephone Encounter (Signed)
Pt reports cough, congestion, x 3 days, now with sinus tenderness, pain right side of face. Denies fever, SOB. States cough productive for clear phlegm. Also reports mild lightheadedness at times.  Denies wheezing. Has taken Dayquil, ineffective. No availability with Dr. Ethlyn Gallery, spoke with Asht[facility]. Appt secured with Dr. Elease Hashimoto for this afternoon.  Reason for Disposition . SEVERE coughing spells (e.g., whooping sound after coughing, vomiting after coughing)  Answer Assessment - Initial Assessment Questions 1. ONSET: "When did the cough begin?"      Friday 2. SEVERITY: "How bad is the cough today?"      severe 3. RESPIRATORY DISTRESS: "Describe your breathing."     Coughing, no SOB 4. FEVER: "Do you have a fever?" If so, ask: "What is your temperature, how was it measured, and when did it start?"    no 5. SPUTUM: "Describe the color of your sputum" (clear, white, yellow, green)     Clear 6. HEMOPTYSIS: "Are you coughing up any blood?" If so ask: "How much?" (flecks, streaks, tablespoons, etc.)     no 7. CARDIAC HISTORY: "Do you have any history of heart disease?" (e.g., heart attack, congestive heart failure)      no 8. LUNG HISTORY: "Do you have any history of lung disease?"  (e.g., pulmonary embolus, asthma, emphysema)    no9. PE RISK FACTORS: "Do you have a history of blood clots?" (or: recent major surgery, recent prolonged travel, bedridden)    no 10. OTHER SYMPTOMS: "Do you have any other symptoms?" (e.g., runny nose, wheezing, chest pain)      Sinus tenderness, lightheadedness  Protocols used: COUGH - ACUTE PRODUCTIVE-A-AH

## 2018-11-07 NOTE — Patient Instructions (Signed)
Sinusitis, Adult  Sinusitis is inflammation of your sinuses. Sinuses are hollow spaces in the bones around your face. Your sinuses are located:   Around your eyes.   In the middle of your forehead.   Behind your nose.   In your cheekbones.  Mucus normally drains out of your sinuses. When your nasal tissues become inflamed or swollen, mucus can become trapped or blocked. This allows bacteria, viruses, and fungi to grow, which leads to infection. Most infections of the sinuses are caused by a virus.  Sinusitis can develop quickly. It can last for up to 4 weeks (acute) or for more than 12 weeks (chronic). Sinusitis often develops after a cold.  What are the causes?  This condition is caused by anything that creates swelling in the sinuses or stops mucus from draining. This includes:   Allergies.   Asthma.   Infection from bacteria or viruses.   Deformities or blockages in your nose or sinuses.   Abnormal growths in the nose (nasal polyps).   Pollutants, such as chemicals or irritants in the air.   Infection from fungi (rare).  What increases the risk?  You are more likely to develop this condition if you:   Have a weak body defense system (immune system).   Do a lot of swimming or diving.   Overuse nasal sprays.   Smoke.  What are the signs or symptoms?  The main symptoms of this condition are pain and a feeling of pressure around the affected sinuses. Other symptoms include:   Stuffy nose or congestion.   Thick drainage from your nose.   Swelling and warmth over the affected sinuses.   Headache.   Upper toothache.   A cough that may get worse at night.   Extra mucus that collects in the throat or the back of the nose (postnasal drip).   Decreased sense of smell and taste.   Fatigue.   A fever.   Sore throat.   Bad breath.  How is this diagnosed?  This condition is diagnosed based on:   Your symptoms.   Your medical history.   A physical exam.   Tests to find out if your condition is  acute or chronic. This may include:  ? Checking your nose for nasal polyps.  ? Viewing your sinuses using a device that has a light (endoscope).  ? Testing for allergies or bacteria.  ? Imaging tests, such as an MRI or CT scan.  In rare cases, a bone biopsy may be done to rule out more serious types of fungal sinus disease.  How is this treated?  Treatment for sinusitis depends on the cause and whether your condition is chronic or acute.   If caused by a virus, your symptoms should go away on their own within 10 days. You may be given medicines to relieve symptoms. They include:  ? Medicines that shrink swollen nasal passages (topical intranasal decongestants).  ? Medicines that treat allergies (antihistamines).  ? A spray that eases inflammation of the nostrils (topical intranasal corticosteroids).  ? Rinses that help get rid of thick mucus in your nose (nasal saline washes).   If caused by bacteria, your health care provider may recommend waiting to see if your symptoms improve. Most bacterial infections will get better without antibiotic medicine. You may be given antibiotics if you have:  ? A severe infection.  ? A weak immune system.   If caused by narrow nasal passages or nasal polyps, you may need   to have surgery.  Follow these instructions at home:  Medicines   Take, use, or apply over-the-counter and prescription medicines only as told by your health care provider. These may include nasal sprays.   If you were prescribed an antibiotic medicine, take it as told by your health care provider. Do not stop taking the antibiotic even if you start to feel better.  Hydrate and humidify     Drink enough fluid to keep your urine pale yellow. Staying hydrated will help to thin your mucus.   Use a cool mist humidifier to keep the humidity level in your home above 50%.   Inhale steam for 10-15 minutes, 3-4 times a day, or as told by your health care provider. You can do this in the bathroom while a hot shower is  running.   Limit your exposure to cool or dry air.  Rest   Rest as much as possible.   Sleep with your head raised (elevated).   Make sure you get enough sleep each night.  General instructions     Apply a warm, moist washcloth to your face 3-4 times a day or as told by your health care provider. This will help with discomfort.   Wash your hands often with soap and water to reduce your exposure to germs. If soap and water are not available, use hand sanitizer.   Do not smoke. Avoid being around people who are smoking (secondhand smoke).   Keep all follow-up visits as told by your health care provider. This is important.  Contact a health care provider if:   You have a fever.   Your symptoms get worse.   Your symptoms do not improve within 10 days.  Get help right away if:   You have a severe headache.   You have persistent vomiting.   You have severe pain or swelling around your face or eyes.   You have vision problems.   You develop confusion.   Your neck is stiff.   You have trouble breathing.  Summary   Sinusitis is soreness and inflammation of your sinuses. Sinuses are hollow spaces in the bones around your face.   This condition is caused by nasal tissues that become inflamed or swollen. The swelling traps or blocks the flow of mucus. This allows bacteria, viruses, and fungi to grow, which leads to infection.   If you were prescribed an antibiotic medicine, take it as told by your health care provider. Do not stop taking the antibiotic even if you start to feel better.   Keep all follow-up visits as told by your health care provider. This is important.  This information is not intended to replace advice given to you by your health care provider. Make sure you discuss any questions you have with your health care provider.  Document Released: 10/12/2005 Document Revised: 03/14/2018 Document Reviewed: 03/14/2018  Elsevier Interactive Patient Education  2019 [employer]

## 2018-11-07 NOTE — Telephone Encounter (Signed)
FYI: Patient has an appointment with you today at 2:40pm.

## 2018-11-07 NOTE — Progress Notes (Signed)
  Subjective:     Patient ID: Tina Melendez, female   DOB: Jan 05, 1981, 38 y.o.   MRN: [id number]  HPI Patient is a non-smoker who was seen with about 1 week history of upper respiratory symptoms.  She had some cough, nasal congestion, malaise.  No fever.  No sore throat.  Cough has been mostly nonproductive.  She has had about 2-day history of some facial sinus pressure.  No bloody or purulent secretions.  Her cough is worse at night.  Denies any wheezing.  No dyspnea. She cannot take decongestant such as Sudafed because of palpitations and insomnia.  She took some DayQuil earlier today with mild relief  Past Medical History:  Diagnosis Date  . Anemia   . Fibroid uterus    Past Surgical History:  Procedure Laterality Date  . DILATION AND CURETTAGE OF UTERUS     miscarriage    reports that she has never smoked. She has never used smokeless tobacco. She reports previous alcohol use. She reports that she does not use drugs. family history includes Alcohol abuse in her brother and mother; Arthritis in her mother and sister; Asthma in her brother; Breast cancer in her paternal grandmother; Sickle cell anemia in her maternal grandmother. Allergies  Allergen Reactions  . Macrobid [Nitrofurantoin Macrocrystal]     Made feel terrible, out of sorts, some shortness of breath     Review of Systems  Constitutional: Positive for fatigue. Negative for chills and fever.  HENT: Positive for congestion and postnasal drip. Negative for sore throat.   Respiratory: Positive for cough. Negative for shortness of breath.        Objective:   Physical Exam Constitutional:      Appearance: Normal appearance.  HENT:     Right Ear: Tympanic membrane normal.     Left Ear: Tympanic membrane normal.     Mouth/Throat:     Pharynx: Oropharynx is clear. No oropharyngeal exudate.  Neck:     Musculoskeletal: Neck supple.  Cardiovascular:     Rate and Rhythm: Normal rate and regular rhythm.   Pulmonary:     Effort: Pulmonary effort is normal.     Breath sounds: Normal breath sounds.  Lymphadenopathy:     Cervical: No cervical adenopathy.  Neurological:     Mental Status: She is alert.        Assessment:     Viral URI with cough.  Nonfocal exam.  No respiratory distress.    Plan:     -Continue with plenty of fluids and rest.  She will take DayQuil during the day and consider NyQuil at night.  No indication for antibiotics at this time.  Tessalon Perles 200 mg every 8 hours as needed for severe cough -For any fever or worsening symptoms  Eulas Post MD [facility]

## 2018-11-08 ENCOUNTER — Emergency Department (HOSPITAL_COMMUNITY): Payer: 59

## 2018-11-08 ENCOUNTER — Encounter (HOSPITAL_COMMUNITY): Payer: Self-pay

## 2018-11-08 ENCOUNTER — Emergency Department (HOSPITAL_COMMUNITY)
Admission: EM | Admit: 2018-11-08 | Discharge: 2018-11-08 | Disposition: A | Discharge status: Home / Self Care | Payer: 59 | Attending: Emergency Medicine | Admitting: Emergency Medicine

## 2018-11-08 ENCOUNTER — Encounter: Payer: Self-pay | Admitting: Physician Assistant

## 2018-11-08 ENCOUNTER — Ambulatory Visit: Payer: 59 | Admitting: Family Medicine

## 2018-11-08 ENCOUNTER — Ambulatory Visit (INDEPENDENT_AMBULATORY_CARE_PROVIDER_SITE_OTHER): Payer: 59 | Admitting: Physician Assistant

## 2018-11-08 ENCOUNTER — Ambulatory Visit: Payer: Self-pay | Admitting: *Deleted

## 2018-11-08 VITALS — BP 130/90 | HR 88

## 2018-11-08 DIAGNOSIS — D582 Other hemoglobinopathies: Secondary | ICD-10-CM | POA: Diagnosis present

## 2018-11-08 DIAGNOSIS — D649 Anemia, unspecified: Secondary | ICD-10-CM | POA: Diagnosis not present

## 2018-11-08 DIAGNOSIS — R0602 Shortness of breath: Secondary | ICD-10-CM | POA: Diagnosis not present

## 2018-11-08 DIAGNOSIS — R1907 Generalized intra-abdominal and pelvic swelling, mass and lump: Secondary | ICD-10-CM | POA: Diagnosis not present

## 2018-11-08 DIAGNOSIS — D219 Benign neoplasm of connective and other soft tissue, unspecified: Secondary | ICD-10-CM

## 2018-11-08 DIAGNOSIS — Z79899 Other long term (current) drug therapy: Secondary | ICD-10-CM | POA: Insufficient documentation

## 2018-11-08 LAB — COMPREHENSIVE METABOLIC PANEL
ALBUMIN: 3.8 g/dL (ref 3.5–5.0)
ALT: 15 U/L (ref 0–44)
AST: 19 U/L (ref 15–41)
Alkaline Phosphatase: 48 U/L (ref 38–126)
Anion gap: 8 (ref 5–15)
BUN: 6 mg/dL (ref 6–20)
CHLORIDE: 109 mmol/L (ref 98–111)
CO2: 24 mmol/L (ref 22–32)
Calcium: 8.6 mg/dL — ABNORMAL LOW (ref 8.9–10.3)
Creatinine, Ser: 0.65 mg/dL (ref 0.44–1.00)
GFR calc Af Amer: >60 mL/min (ref >60)
GFR calc non Af Amer: >60 mL/min (ref >60)
Glucose, Bld: 94 mg/dL (ref 70–99)
Potassium: 3.9 mmol/L (ref 3.5–5.1)
Sodium: 141 mmol/L (ref 135–145)
Total Bilirubin: 0.7 mg/dL (ref 0.3–1.2)
Total Protein: 6.6 g/dL (ref 6.5–8.1)

## 2018-11-08 LAB — CBC WITH DIFFERENTIAL/PLATELET
ABS IMMATURE GRANULOCYTES: 0.04 10*3/uL (ref 0.00–0.07)
Basophils Absolute: 0 10*3/uL (ref 0.0–0.1)
Basophils Relative: 1 %
Eosinophils Absolute: 0.3 10*3/uL (ref 0.0–0.5)
Eosinophils Relative: 3 %
HEMATOCRIT: 33.5 % — AB (ref 36.0–46.0)
HEMOGLOBIN: 9.9 g/dL — AB (ref 12.0–15.0)
Immature Granulocytes: 1 %
LYMPHS PCT: 29 %
Lymphs Abs: 2.3 10*3/uL (ref 0.7–4.0)
MCH: 19.4 pg — ABNORMAL LOW (ref 26.0–34.0)
MCHC: 29.6 g/dL — ABNORMAL LOW (ref 30.0–36.0)
MCV: 65.8 fL — ABNORMAL LOW (ref 80.0–100.0)
Monocytes Absolute: 0.7 10*3/uL (ref 0.1–1.0)
Monocytes Relative: 9 %
Neutro Abs: 4.5 10*3/uL (ref 1.7–7.7)
Neutrophils Relative %: 57 %
Platelets: 180 10*3/uL (ref 150–400)
RBC: 5.09 MIL/uL (ref 3.87–5.11)
RDW: 23.2 % — ABNORMAL HIGH (ref 11.5–15.5)
WBC: 7.8 10*3/uL (ref 4.0–10.5)
nRBC: 0 % (ref 0.0–0.2)

## 2018-11-08 LAB — I-STAT BETA HCG BLOOD, ED (MC, WL, AP ONLY): I-stat hCG, quantitative: <5 m[IU]/mL (ref <5)

## 2018-11-08 LAB — POCT HEMOGLOBIN: Hemoglobin: 9 g/dL — AB (ref 11–14.6)

## 2018-11-08 LAB — D-DIMER, QUANTITATIVE: D-Dimer, Quant: 0.58 ug/mL-FEU — ABNORMAL HIGH (ref 0.00–0.50)

## 2018-11-08 MED ORDER — IOPAMIDOL (ISOVUE-370) INJECTION 76%
100.0000 mL | Freq: Once | INTRAVENOUS | Status: AC | PRN
  Administered 2018-11-08: 100 mL via INTRAVENOUS

## 2018-11-08 MED ORDER — SODIUM CHLORIDE (PF) 0.9 % IJ SOLN
INTRAMUSCULAR | Status: AC
  Filled 2018-11-08: qty 50

## 2018-11-08 MED ORDER — IOPAMIDOL (ISOVUE-370) INJECTION 76%
INTRAVENOUS | Status: AC
  Filled 2018-11-08: qty 100

## 2018-11-08 NOTE — ED Triage Notes (Signed)
Pt presents via EMS from a doctor's office. 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 Pt reports that since December, she has been having some issues with low hemoglobin. Pt reporting shortness of breath, chest pressure today.

## 2018-11-08 NOTE — Telephone Encounter (Signed)
She can take plain Mucinex.  This should not likely cause any insomnia.

## 2018-11-08 NOTE — Telephone Encounter (Signed)
Called patient and LMOVM with message per Dr. Elease Hashimoto: She can take plain Mucinex. This should not likely cause any insomnia.  [place] for [facility] to Discuss results / PCP / recommendations / Schedule patient  CRM Created.

## 2018-11-08 NOTE — Telephone Encounter (Signed)
Please advise 

## 2018-11-08 NOTE — Telephone Encounter (Signed)
Summary: please advise   Pt saw dr burchette yesterday for nasal congestion and she would like to know if she can take mucinex . Pt had trouble sleeping last night     Per [facility]-no interaction with current medications as long as patient uses regular mucinex. ( no DM) Advised patient to increase fluids.  Patient is concerned about her sleeping- it is the second day of her menstrual cycle and she is afraid she is going to get even more anemic. She can feel her heart rate going up- that usually happens when she is anemic. Patient thinks she even need an iron transfusion.  Patient does have an appointment tomorrow with GYN and plans to discuss cycle control tomorrow. She wants advisement from PCP on iron level control. She is aware PCP not in office today.  Reason for Disposition . Caller requesting a NON-URGENT new prescription or refill and triager unable to refill per unit policy  Answer Assessment - Initial Assessment Questions 1. SYMPTOMS: "Do you have any symptoms?"     Medication use question 2. SEVERITY: If symptoms are present, ask "Are they mild, moderate or severe?"     Patient is having trouble sleeping due to cough and congestion. She states she is also anemic which makes it more difficult.  Protocols used: MEDICATION QUESTION CALL-A-AH

## 2018-11-08 NOTE — Progress Notes (Signed)
Patient presents with her husband.  She was sitting with her husband while he was being seen and starting having difficulty breathing and SOB.  Vitals:   11/08/18 1350  BP: 130/90  Pulse: 88  SpO2: 99%   POC hemoglobin was checked and was 9.0. Last time it was checked was about 10 days and ago it was 9.8. She reports that she started her period a few days ago and it has been very heavy.  She endorses: palpitations, L sided numbness and tingling, chest heaviness, sensation that the room is spinning  EMS was called and I personally gave them report. She is going to the hospital via EMS.  Inda Coke PA-C

## 2018-11-08 NOTE — ED Notes (Signed)
Patient transported to CT 

## 2018-11-08 NOTE — ED Provider Notes (Signed)
[facility] DEPT Provider Note   CSN: [id number] Arrival date & time: 11/08/18  1442     History   Chief Complaint Chief Complaint  Patient presents with  . Abnormal Lab    HPI Tina Melendez is a 38 y.o. female with a past medical history of anemia, fibroids, who presents today for evlaution of shortness of breath, palpitations, and nasal congestion.  The shortness of breath, palpitations and generalized fatigue have been going on since the middle of December.  She has been seen twice in the [facility] system and once at [facility] for these symptoms along with multiple PCP visits.  She reports poor appetite, has lost weight 20 lbs over "a couple months."    She has been being treated as an outpatient for anemia, reports compliance with iron supplements and eating high iron foods.  She reports that they have continued to worsen.  She reports generalized fatigue.    No new pains or aches.  She expresses concern that she started her menstrual cycle and that she may be becoming more anemic.  She expresses anxiety over this.  Today she was at the doctor's office with someone else and was noted to be very short of breath.  They performed a POCT hemoglobin which was 9 and referred her here for further evaluation.  She denies any dark tarry sticky stools.  HPI  Past Medical History:  Diagnosis Date  . Anemia   . Fibroid uterus     Patient Active Problem List   Diagnosis Date Noted  . Anemia 11/02/2018  . Fibroid 11/02/2018    Past Surgical History:  Procedure Laterality Date  . DILATION AND CURETTAGE OF UTERUS     miscarriage     OB History   No obstetric history on file.      Home Medications    Prior to Admission medications   Medication Sig Start Date End Date Taking? Authorizing Provider  ferrous sulfate 325 (65 FE) MG EC tablet Take 1 tablet (325 mg total) by mouth 2 (two) times daily. 11/02/18  Yes Koberlein, Junell C, MD    magnesium oxide (MAG-OX) 400 MG tablet Take 400 mg by mouth daily.   Yes [provider]  Multiple Vitamin (MULTIVITAMIN WITH MINERALS) TABS tablet Take 1 tablet by mouth daily.   Yes [provider]  Probiotic Product (PROBIOTIC-10 PO) Take 1 tablet by mouth 2 (two) times daily.   Yes [provider]  benzonatate (TESSALON) 200 MG capsule Take 1 capsule (200 mg total) by mouth 3 (three) times daily as needed. 11/07/18   Burchette, Alinda Sierras, MD    Family History Family History  Problem Relation Age of Onset  . Alcohol abuse Mother   . Arthritis Mother   . Arthritis Sister   . Alcohol abuse Brother   . Asthma Brother   . Sickle cell anemia Maternal Grandmother   . Breast cancer Paternal Grandmother     Social History Social History   Tobacco Use  . Smoking status: Never Smoker  . Smokeless tobacco: Never Used  Substance Use Topics  . Alcohol use: Not Currently    Comment: occ  . Drug use: Never     Allergies   Macrobid [nitrofurantoin macrocrystal]   Review of Systems Review of Systems  Constitutional: Positive for appetite change and fatigue. Negative for chills and fever.  HENT: Positive for congestion, postnasal drip, rhinorrhea, sinus pressure and sinus pain. Negative for sore throat.  Respiratory: Positive for cough and shortness of breath. Negative for chest tightness.   Cardiovascular: Positive for chest pain and palpitations.  Gastrointestinal: Negative for abdominal pain, diarrhea, nausea and vomiting.  Genitourinary: Negative for dysuria, hematuria, vaginal bleeding, vaginal discharge and vaginal pain.  Musculoskeletal: Negative for back pain and neck pain.  Neurological: Negative for weakness and headaches.  All other systems reviewed and are negative.    Physical Exam Updated Vital Signs BP (!) 127/96   Pulse 80   Temp 99.3 F (37.4 C) (Oral)   Resp 13   Ht 5\' 7"  (1.702 m)   Wt 78.9 kg   LMP 11/08/2018 (Approximate)    SpO2 99%   BMI 27.25 kg/m   Physical Exam Vitals signs and nursing note reviewed.  Constitutional:      General: She is not in acute distress.    Appearance: She is well-developed and normal weight. She is not ill-appearing.  HENT:     Head: Normocephalic and atraumatic.     Nose: Congestion and rhinorrhea present.     Mouth/Throat:     Mouth: Mucous membranes are moist.  Eyes:     Conjunctiva/sclera: Conjunctivae normal.  Neck:     Musculoskeletal: Normal range of motion and neck supple.  Cardiovascular:     Rate and Rhythm: Normal rate and regular rhythm.     Pulses: Normal pulses.     Heart sounds: Normal heart sounds. No murmur.  Pulmonary:     Effort: Pulmonary effort is normal. No respiratory distress.     Breath sounds: Normal breath sounds. No stridor. No rales.  Chest:     Chest wall: No tenderness.  Abdominal:     General: Bowel sounds are normal. There is no distension.     Palpations: Abdomen is soft. There is mass (Palpable, nonpulsatile mass palpated in the right sided abdomen approximately at the level of the umbilicus.).     Tenderness: There is no abdominal tenderness. There is no rebound.  Skin:    General: Skin is warm and dry.     Comments: Diffuse patchy loss of pigmentation, consistent with vitiligo  Neurological:     General: No focal deficit present.     Mental Status: She is alert.  Psychiatric:        Mood and Affect: Mood normal.        Behavior: Behavior normal.      ED Treatments / Results  Labs (all labs ordered are listed, but only abnormal results are displayed) Labs Reviewed  CBC WITH DIFFERENTIAL/PLATELET - Abnormal; Notable for the following components:      Result Value   Hemoglobin 9.9 (*)    HCT 33.5 (*)    MCV 65.8 (*)    MCH 19.4 (*)    MCHC 29.6 (*)    RDW 23.2 (*)    All other components within normal limits  COMPREHENSIVE METABOLIC PANEL - Abnormal; Notable for the following components:   Calcium 8.6 (*)    All  other components within normal limits  D-DIMER, QUANTITATIVE (NOT AT [facility]) - Abnormal; Notable for the following components:   D-Dimer, Quant 0.58 (*)    All other components within normal limits  I-STAT BETA HCG BLOOD, ED ([facility], [facility], [facility] ONLY)    EKG EKG Interpretation  Date/Time:  Tuesday November 08 2018 14:59:27 EST Ventricular Rate:  76 PR Interval:    QRS Duration: 93 QT Interval:  381 QTC Calculation: 429 R Axis:   7 Text Interpretation:  Sinus rhythm Low voltage, precordial leads RSR' in V1 or V2, probably normal variant Baseline wander in lead(s) V1 No significant change since last tracing Confirmed by Duffy Bruce [phone]) on 11/08/2018 5:44:00 PM   Radiology Dg Chest 2 View  Result Date: 11/08/2018 CLINICAL DATA:  Shortness of Breath EXAM: CHEST - 2 VIEW COMPARISON:  10/29/2018 FINDINGS: The heart size and mediastinal contours are within normal limits. Both lungs are clear. The visualized skeletal structures are unremarkable. IMPRESSION: No active cardiopulmonary disease. Electronically Signed   By: Inez Catalina M.D.   On: 11/08/2018 16:45   Ct Angio Chest Pe W/cm &/or Wo Cm  Result Date: 11/08/2018 CLINICAL DATA:  Chest pressure and shortness of breath today. Anemia. Uterine fibroids. EXAM: CT ANGIOGRAPHY CHEST CT ABDOMEN AND PELVIS WITH CONTRAST TECHNIQUE: Multidetector CT imaging of the chest was performed using the standard protocol during bolus administration of intravenous contrast. Multiplanar CT image reconstructions and MIPs were obtained to evaluate the vascular anatomy. Multidetector CT imaging of the abdomen and pelvis was performed using the standard protocol during bolus administration of intravenous contrast. CONTRAST:  161mL ISOVUE-370 IOPAMIDOL (ISOVUE-370) INJECTION 76% COMPARISON:  None. FINDINGS: CTA CHEST FINDINGS Cardiovascular: Suboptimally opacified pulmonary arteries with no pulmonary arterial filling defects seen. Normal appearing aorta. Normal sized heart.  Mediastinum/Nodes: No enlarged mediastinal, hilar, or axillary lymph nodes. Thyroid gland, trachea, and esophagus demonstrate no significant findings. Lungs/Pleura: Lungs are clear. No pleural effusion or pneumothorax. Musculoskeletal: Mild thoracic spine degenerative changes. Mild sternomanubrial joint degenerative changes. Review of the MIP images confirms the above findings. CT ABDOMEN and PELVIS FINDINGS Hepatobiliary: No focal liver abnormality is seen. No gallstones, gallbladder wall thickening, or biliary dilatation. Pancreas: Unremarkable. No pancreatic ductal dilatation or surrounding inflammatory changes. Spleen: Normal in size without focal abnormality. Adrenals/Urinary Tract: Adrenal glands are unremarkable. Kidneys are normal, without renal calculi, focal lesion, or hydronephrosis. Bladder is unremarkable. Stomach/Bowel: Stomach is within normal limits. Appendix appears normal. No evidence of bowel wall thickening, distention, or inflammatory changes. The appendix is retrocecal in location. Vascular/Lymphatic: No significant vascular findings are present. No enlarged abdominal or pelvic lymph nodes. Reproductive: Multiple rounded, heterogeneous uterine masses. The largest is an exophytic mass on the right containing coarse calcifications. This mass measures 7.9 x 6.8 cm on image number 57 series 4 and 9.3 cm in length on sagittal image number 42 series 14. The intrauterine masses are deforming and displacing the endometrial cavity. Several left ovarian cysts are demonstrated. The largest measures 4.0 cm in maximum diameter on coronal image number 52. The right ovary is difficult to differentiate from the uterine masses. Other: No abdominal wall hernia or abnormality. No abdominopelvic ascites. Musculoskeletal: Minimal lumbar spine degenerative changes. IMPRESSION: 1. Suboptimally opacified pulmonary arteries with no pulmonary emboli seen. 2. No acute abnormality in the chest, abdomen or pelvis. 3.  Multiple uterine fibroids, including multiple submucosal fibroids. 4. Several left ovarian cysts, the largest measuring 4.0 cm. This is almost certainly benign, and no specific imaging follow up is recommended according to the Society of Radiologists in Ultrasound 2010 Consensus Conference Statement (D Clovis Riley et al. Management of Asymptomatic Ovarian and Other Adnexal Cysts Imaged at [place]: Society of Radiologists in [place] Statement 2010. Radiology 256 (Sept 2010): 098-119.). Electronically Signed   By: Claudie Revering M.D.   On: 11/08/2018 19:49   Ct Abdomen Pelvis W Contrast  Result Date: 11/08/2018 CLINICAL DATA:  Chest pressure and shortness of breath today. Anemia. Uterine fibroids. EXAM: CT ANGIOGRAPHY CHEST CT  ABDOMEN AND PELVIS WITH CONTRAST TECHNIQUE: Multidetector CT imaging of the chest was performed using the standard protocol during bolus administration of intravenous contrast. Multiplanar CT image reconstructions and MIPs were obtained to evaluate the vascular anatomy. Multidetector CT imaging of the abdomen and pelvis was performed using the standard protocol during bolus administration of intravenous contrast. CONTRAST:  140mL ISOVUE-370 IOPAMIDOL (ISOVUE-370) INJECTION 76% COMPARISON:  None. FINDINGS: CTA CHEST FINDINGS Cardiovascular: Suboptimally opacified pulmonary arteries with no pulmonary arterial filling defects seen. Normal appearing aorta. Normal sized heart. Mediastinum/Nodes: No enlarged mediastinal, hilar, or axillary lymph nodes. Thyroid gland, trachea, and esophagus demonstrate no significant findings. Lungs/Pleura: Lungs are clear. No pleural effusion or pneumothorax. Musculoskeletal: Mild thoracic spine degenerative changes. Mild sternomanubrial joint degenerative changes. Review of the MIP images confirms the above findings. CT ABDOMEN and PELVIS FINDINGS Hepatobiliary: No focal liver abnormality is seen. No gallstones, gallbladder wall thickening, or biliary  dilatation. Pancreas: Unremarkable. No pancreatic ductal dilatation or surrounding inflammatory changes. Spleen: Normal in size without focal abnormality. Adrenals/Urinary Tract: Adrenal glands are unremarkable. Kidneys are normal, without renal calculi, focal lesion, or hydronephrosis. Bladder is unremarkable. Stomach/Bowel: Stomach is within normal limits. Appendix appears normal. No evidence of bowel wall thickening, distention, or inflammatory changes. The appendix is retrocecal in location. Vascular/Lymphatic: No significant vascular findings are present. No enlarged abdominal or pelvic lymph nodes. Reproductive: Multiple rounded, heterogeneous uterine masses. The largest is an exophytic mass on the right containing coarse calcifications. This mass measures 7.9 x 6.8 cm on image number 57 series 4 and 9.3 cm in length on sagittal image number 42 series 14. The intrauterine masses are deforming and displacing the endometrial cavity. Several left ovarian cysts are demonstrated. The largest measures 4.0 cm in maximum diameter on coronal image number 52. The right ovary is difficult to differentiate from the uterine masses. Other: No abdominal wall hernia or abnormality. No abdominopelvic ascites. Musculoskeletal: Minimal lumbar spine degenerative changes. IMPRESSION: 1. Suboptimally opacified pulmonary arteries with no pulmonary emboli seen. 2. No acute abnormality in the chest, abdomen or pelvis. 3. Multiple uterine fibroids, including multiple submucosal fibroids. 4. Several left ovarian cysts, the largest measuring 4.0 cm. This is almost certainly benign, and no specific imaging follow up is recommended according to the Society of Radiologists in Ultrasound 2010 Consensus Conference Statement (D Clovis Riley et al. Management of Asymptomatic Ovarian and Other Adnexal Cysts Imaged at [place]: Society of Radiologists in [place] Statement 2010. Radiology 256 (Sept 2010): 086-761.). Electronically  Signed   By: Claudie Revering M.D.   On: 11/08/2018 19:49    Procedures Procedures (including critical care time)  Medications Ordered in ED Medications  sodium chloride (PF) 0.9 % injection (has no administration in time range)  iopamidol (ISOVUE-370) 76 % injection (has no administration in time range)  iopamidol (ISOVUE-370) 76 % injection 100 mL (100 mLs Intravenous Contrast Given 11/08/18 1915)     Initial Impression / Assessment and Plan / ED Course  I have reviewed the triage vital signs and the nursing notes.  Pertinent labs & imaging results that were available during my care of the patient were reviewed by me and considered in my medical decision making (see chart for details).  Clinical Course as of Nov 08 2329  Tue Nov 08, 2018  1630 Attempted to see patient, in [facility]   [EH]    Clinical Course User Index [EH] Lorin Glass, PA-C   Patient presents today for evaluation of concern for anemia with shortness of  breath, and fatigue.  Labs were obtained and reviewed, her hemoglobin is 9.9 which is slightly improved from her labs of 9.8 on 1/4.  She reportedly had a POC hemoglobin at PCPs office today which was 9, suspect that this is inaccurate.    Chart review shows that when she has presented previously with the symptoms she has had multiple troponins that were unremarkable.  Do not see evidence of a d-dimer being performed on previous visits.  D-dimer was ordered given persistence of symptoms and shortness of breath after discussion with patient.  D-dimer is slightly elevated.  CTA PE study indicated, discussed risks and benefits with patient.  On exam patient does have a palpable nonpulsatile mass on the right-sided abdomen.  She does have a known history of fibroids, however also reports that she has had unintentional weight loss recently in addition to her not feeling well.  She has an appointment with OB/GYN tomorrow, however given palpable mass in the setting of weight loss  concern for malignancy, especially given concern for PE.  CT abdomen pelvis was performed.  CT PE did not show any evidence of PE, or other acute abnormality.  CT abdomen pelvis showed multiple fibroids and ovarian cysts, suspect that the palpable mass is a large ovarian fibroid.  She has a appointment scheduled tomorrow with OB/GYN and encouraged her to keep this appointment for further evaluation.  Given that her hemoglobin is not significantly lower, and she does not have a PE I suspect that her symptoms exacerbation may be related to anxiety due to starting her menstrual cycle.  We discussed mindfulness based stress reduction, and conservative measures.  She has close outpatient follow up.    Return precautions were discussed with patient who states their understanding.  At the time of discharge patient denied any unaddressed complaints or concerns.  Patient is agreeable for discharge home.  Final Clinical Impressions(s) / ED Diagnoses   Final diagnoses:  Anemia, unspecified type  Fibroid  Shortness of breath    ED Discharge Orders    None       Ollen Gross 11/08/18 2344    Duffy Bruce, MD 11/09/18 1003

## 2018-11-08 NOTE — Discharge Instructions (Signed)
Today your hemoglobin which is your anemia number is improved.  Please keep taking the iron supplements that you are taking.  Please make sure you are staying well-hydrated and eating a healthy, well-balanced diet.  Please keep your appointment with your OB/GYN tomorrow.

## 2018-11-09 ENCOUNTER — Ambulatory Visit (INDEPENDENT_AMBULATORY_CARE_PROVIDER_SITE_OTHER): Payer: 59 | Admitting: Family Medicine

## 2018-11-09 ENCOUNTER — Ambulatory Visit: Payer: Self-pay | Admitting: *Deleted

## 2018-11-09 ENCOUNTER — Encounter: Payer: Self-pay | Admitting: Family Medicine

## 2018-11-09 VITALS — BP 124/76 | HR 99 | Temp 98.3°F | Resp 12 | Ht 67.0 in | Wt 173.0 lb

## 2018-11-09 DIAGNOSIS — G4700 Insomnia, unspecified: Secondary | ICD-10-CM | POA: Diagnosis not present

## 2018-11-09 DIAGNOSIS — F322 Major depressive disorder, single episode, severe without psychotic features: Secondary | ICD-10-CM | POA: Diagnosis not present

## 2018-11-09 DIAGNOSIS — D509 Iron deficiency anemia, unspecified: Secondary | ICD-10-CM

## 2018-11-09 DIAGNOSIS — F411 Generalized anxiety disorder: Secondary | ICD-10-CM | POA: Diagnosis not present

## 2018-11-09 MED ORDER — SERTRALINE HCL 25 MG PO TABS: 25.0000 mg | ORAL_TABLET | Freq: Every day | ORAL | 0 refills | Status: DC

## 2018-11-09 MED ORDER — HYDROXYZINE HCL 25 MG PO TABS: 25.0000 mg | ORAL_TABLET | Freq: Three times a day (TID) | ORAL | 0 refills | Status: DC | PRN

## 2018-11-09 NOTE — Patient Instructions (Signed)
  [name] I have seen you today for an acute visit.  A few things to remember from today's visit:   Insomnia, unspecified type - Plan: hydrOXYzine (ATARAX/VISTARIL) 25 MG tablet  GAD (generalized anxiety disorder) - Plan: sertraline (ZOLOFT) 25 MG tablet, hydrOXYzine (ATARAX/VISTARIL) 25 MG tablet  Depression, major, single episode, severe (HCC) - Plan: sertraline (ZOLOFT) 25 MG tablet   If medications prescribed today, they will not be refill upon request, a follow up appointment with PCP will be necessary to discuss continuation of of treatment if appropriate.  Today we started Setraline, this type of medications can increase suicidal risk. This is more prevalent among children,adolecents, and young adults with major depression or other psychiatric disorders. It can also make depression worse. Most common side effects are gastrointestinal, self limited after a few weeks: diarrhea, nausea, constipation  Or diarrhea among some.  In general it is well tolerated. We will follow closely.   In general please monitor for signs of worsening symptoms and seek immediate medical attention if any concerning.   I hope you get better soon!

## 2018-11-09 NOTE — Progress Notes (Signed)
ACUTE VISIT   HPI:  Chief Complaint  Patient presents with  . Anxiety  . Panic Attack  . Unable to sleep at night    Tina Melendez is a 38 y.o. female, who is here today complaining of worsening anxiety Symptoms started in 09/2018, she has had some stressors. She is living with "other people" and this has aggravated symptoms. She is planning on moving this weekend to her own place with her husband.  She reports this problem as new but later during interview she states that years ago she was on Lexapro and she did not tolerate med well. Decreased appetite and trouble sleeping. Sleeping a couple hours. Heart racing,SOB,chest tightness. She has tried OTC tea.  She has been in the ER 5 times since 09/2018 c/o SOB. According to pt,she has had negative work-up and she was told symptoms were related to anxiety. She denies suicidal thoughts.  Hx of fibroid and heavy menses. She is on Fe Sulfate.  Lab Results  Component Value Date   WBC 7.8 11/08/2018   HGB 9.9 (L) 11/08/2018   HCT 33.5 (L) 11/08/2018   MCV 65.8 (L) 11/08/2018   PLT 180 11/08/2018   Lab Results  Component Value Date   CREATININE 0.65 11/08/2018   BUN 6 11/08/2018   NA 141 11/08/2018   K 3.9 11/08/2018   CL 109 11/08/2018   CO2 24 11/08/2018   Lab Results  Component Value Date   ALT 15 11/08/2018   AST 19 11/08/2018   ALKPHOS 48 11/08/2018   BILITOT 0.7 11/08/2018    Lab Results  Component Value Date   DDIMER 0.58 (H) 11/08/2018   Abdominal and chest CT done on 11/08/18: Negative for PE, multiple ovarian cyst and fibroids.  She has followed with gyn, Dr Stefano Gaul.   Depression screen PHQ 2/9 11/09/2018  Decreased Interest 3  Down, Depressed, Hopeless 3  PHQ - 2 Score 6  Altered sleeping 3  Tired, decreased energy 3  Change in appetite 3  Feeling bad or failure about yourself  3  Trouble concentrating 3  Moving slowly or fidgety/restless 3  Suicidal thoughts 0    PHQ-9 Score 24  Difficult doing work/chores Extremely dIfficult   She denies Hx of bipolar disorder.   Review of Systems  Constitutional: Positive for appetite change and fatigue. Negative for chills and fever.  Respiratory: Positive for chest tightness and shortness of breath. Negative for cough and wheezing.   Cardiovascular: Positive for palpitations. Negative for chest pain.  Gastrointestinal: Negative for abdominal pain, diarrhea, nausea and vomiting.  Endocrine: Negative for cold intolerance and heat intolerance.  Neurological: Negative for syncope, weakness and headaches.  Psychiatric/Behavioral: Positive for sleep disturbance. Negative for confusion, hallucinations and suicidal ideas. The patient is nervous/anxious.       Current Outpatient Medications on File Prior to Visit  Medication Sig Dispense Refill  . benzonatate (TESSALON) 200 MG capsule Take 1 capsule (200 mg total) by mouth 3 (three) times daily as needed. 30 capsule 0  . ferrous sulfate 325 (65 FE) MG EC tablet Take 1 tablet (325 mg total) by mouth 2 (two) times daily. 60 tablet 2  . magnesium oxide (MAG-OX) 400 MG tablet Take 400 mg by mouth daily.    . Multiple Vitamin (MULTIVITAMIN WITH MINERALS) TABS tablet Take 1 tablet by mouth daily.    . Probiotic Product (PROBIOTIC-10 PO) Take 1 tablet by mouth 2 (two) times daily.     No  current facility-administered medications on file prior to visit.      Past Medical History:  Diagnosis Date  . Anemia   . Fibroid uterus    Allergies  Allergen Reactions  . Macrobid [Nitrofurantoin Macrocrystal]     Made feel terrible, out of sorts, some shortness of breath    Social History   Socioeconomic History  . Marital status: Married    Spouse name: Not on file  . Number of children: Not on file  . Years of education: Not on file  . Highest education level: Not on file  Occupational History  . Not on file  Social Needs  . Financial resource strain: Not on  file  . Food insecurity:    Worry: Not on file    Inability: Not on file  . Transportation needs:    Medical: Not on file    Non-medical: Not on file  Tobacco Use  . Smoking status: Never Smoker  . Smokeless tobacco: Never Used  Substance and Sexual Activity  . Alcohol use: Not Currently    Comment: occ  . Drug use: Never  . Sexual activity: Yes    Partners: Male  Lifestyle  . Physical activity:    Days per week: Not on file    Minutes per session: Not on file  . Stress: Not on file  Relationships  . Social connections:    Talks on phone: Not on file    Gets together: Not on file    Attends religious service: Not on file    Active member of club or organization: Not on file    Attends meetings of clubs or organizations: Not on file    Relationship status: Not on file  Other Topics Concern  . Not on file  Social History Narrative  . Not on file    Vitals:   11/09/18 1036  BP: 124/76  Pulse: 99  Resp: 12  Temp: 98.3 F (36.8 C)  SpO2: 99%   Body mass index is 27.1 kg/m.   Physical Exam  Nursing note and vitals reviewed. Constitutional: She is oriented to person, place, and time. She appears well-developed and well-nourished. No distress.  HENT:  Head: Normocephalic and atraumatic.  Mouth/Throat: Oropharynx is clear and moist and mucous membranes are normal.  Eyes: Pupils are equal, round, and reactive to light. Conjunctivae are normal.  Cardiovascular: Normal rate and regular rhythm.  No murmur heard. Respiratory: Effort normal and breath sounds normal. No respiratory distress.  Musculoskeletal:        General: No edema.  Neurological: She is alert and oriented to person, place, and time. She has normal strength. Gait normal.  Skin: Skin is warm. No erythema.  Psychiatric: Her speech is normal. Her mood appears anxious. She expresses no suicidal ideation. She expresses no suicidal plans.  Fairly groomed,good eye contact.     ASSESSMENT AND PLAN:  Ms.  Tina Melendez was seen today for anxiety, panic attack and unable to sleep at night.  Diagnoses and all orders for this visit:  Insomnia, unspecified type We discussed possible causes and treatment options. Good sleep hygiene recommended. If related to anxiety or depression SSRI med may help. Hydroxyzine may also help. Monitor for worsening symptom. F/U with PCP in 3-4 weeks,before if needed.  -     hydrOXYzine (ATARAX/VISTARIL) 25 MG tablet; Take 1 tablet (25 mg total) by mouth 3 (three) times daily as needed for itching.  GAD (generalized anxiety disorder) We discussed in detail pharmacologic treatment choices  and side effects. She is very afraid of having side effects and wants to be ensured she will not have any,which I cannot guarantee. Finally she agrees with trying Sertraline low dose. Clearly instructed about warning signs. She voices understanding.  -     sertraline (ZOLOFT) 25 MG tablet; Take 1 tablet (25 mg total) by mouth daily. -     hydrOXYzine (ATARAX/VISTARIL) 25 MG tablet; Take 1 tablet (25 mg total) by mouth 3 (three) times daily as needed for itching.  Depression, major, single episode, severe ([place]) SSRI's side effects discussed. Physiotherapy also recommended, flyer with information given. Instructed about warning signs. F/U with PCP in 3-4 weks.  -     sertraline (ZOLOFT) 25 MG tablet; Take 1 tablet (25 mg total) by mouth daily.    Most likely relIron deficiency anemia, unspecified iron deficiency anemia typeated to DUB. No changes in iron supplementation. Follow with gyn and PCP.    Return in about 4 weeks (around 12/07/2018) for anxiety with PCP.      Betty G. Martinique, MD  [facility]. [place] office.

## 2018-11-09 NOTE — Telephone Encounter (Signed)
She can certainly try sertraline 25 mg, 0.5 tablet.  If she is still feeling "hyper", she can discontinue medication and continue just with hydroxyzine. Be sure she has an appointment with PCP in 3 weeks, earlier if needed.  Thanks, BJ

## 2018-11-09 NOTE — Telephone Encounter (Signed)
Patient returned call to [facility]. PEC relayed message and patient had further questions. Patient transferred to RN. Patient reports she was concerned about taking Hydroxizine since she seen one side effect is dizziness. RN informed patient to take medication and if she has dizziness from medication to return call to office and let us know so MD can further advise. Patient also wanted to know if Tessalon will interfere with Hydroxizine. Patient advised that pharmacy would have notified her and MD if there was an interaction. Patient also concerned about drowsiness with Tessalon. Informed patient to take medication at night to see if drowsiness occurs.

## 2018-11-09 NOTE — Telephone Encounter (Signed)
Pt saw Dr. Martinique today and prescribed Zoloft 25 mg.  States she took one this afternoon and is now "Feeling overly hyper." I'm having to walk around with my husband I'm so hyper."  Denies any other symptoms. Pt questioning if she should take 1/2 dose. States she did not sleep last night and "I don't want to be awake all night tonight."  Please advise: [phone]  Reason for Disposition . Caller has NON-URGENT medication question about med that PCP prescribed and triager unable to answer question  Answer Assessment - Initial Assessment Questions 1. SYMPTOMS: "Do you have any symptoms?"     "Overly hyper." 2. SEVERITY: If symptoms are present, ask "Are they mild, moderate or severe?"  Protocols used: MEDICATION QUESTION CALL-A-AH

## 2018-11-09 NOTE — Telephone Encounter (Signed)
Message sent to Dr. Jordan for review. 

## 2018-11-09 NOTE — Telephone Encounter (Signed)
Please advise 

## 2018-11-10 ENCOUNTER — Other Ambulatory Visit: Payer: Self-pay

## 2018-11-10 ENCOUNTER — Encounter (HOSPITAL_COMMUNITY): Payer: Self-pay | Admitting: *Deleted

## 2018-11-10 ENCOUNTER — Emergency Department (HOSPITAL_COMMUNITY)
Admission: EM | Admit: 2018-11-10 | Discharge: 2018-11-10 | Disposition: A | Discharge status: Home / Self Care | Payer: 59 | Attending: Emergency Medicine | Admitting: Emergency Medicine

## 2018-11-10 DIAGNOSIS — R451 Restlessness and agitation: Secondary | ICD-10-CM | POA: Diagnosis not present

## 2018-11-10 DIAGNOSIS — Y658 Other specified misadventures during surgical and medical care: Secondary | ICD-10-CM | POA: Insufficient documentation

## 2018-11-10 DIAGNOSIS — R1084 Generalized abdominal pain: Secondary | ICD-10-CM | POA: Diagnosis present

## 2018-11-10 DIAGNOSIS — R42 Dizziness and giddiness: Secondary | ICD-10-CM | POA: Diagnosis not present

## 2018-11-10 DIAGNOSIS — T887XXA Unspecified adverse effect of drug or medicament, initial encounter: Secondary | ICD-10-CM | POA: Diagnosis not present

## 2018-11-10 DIAGNOSIS — T50905A Adverse effect of unspecified drugs, medicaments and biological substances, initial encounter: Secondary | ICD-10-CM | POA: Insufficient documentation

## 2018-11-10 MED ORDER — ONDANSETRON 8 MG PO TBDP
8.0000 mg | ORAL_TABLET | Freq: Once | ORAL | Status: AC
  Administered 2018-11-10: 8 mg via ORAL
  Filled 2018-11-10: qty 1

## 2018-11-10 MED ORDER — ONDANSETRON 4 MG PO TBDP: 4.0000 mg | ORAL_TABLET | Freq: Three times a day (TID) | ORAL | 0 refills | Status: DC | PRN

## 2018-11-10 NOTE — Discharge Instructions (Signed)
It may take 1 to 2 days for your body to fully metabolize Zoloft.  There is no way to accelerate this process.  We recommend that you manage your symptoms with Zofran for nausea as well as over-the-counter sleep aids if you continue to have difficulty sleeping.  You may try medications such as Benadryl, Unisom, melatonin.  Take as instructed on the bottle.  We recommend that you discuss these adverse side effects with your doctor and discontinue use until follow-up.

## 2018-11-10 NOTE — ED Notes (Signed)
Tina Melendez out to nurse station requesting d/c home will inform provider

## 2018-11-10 NOTE — ED Triage Notes (Signed)
Pt took a Zoloft today and began to have upset stomach, dizziness, stomach cramping, and restlessness. Pt was prescribed 25mg  and today was pt's first dose.

## 2018-11-10 NOTE — ED Provider Notes (Addendum)
[facility] DEPT Provider Note   CSN: [id number] Arrival date & time: 11/10/18  0010     History   Chief Complaint No chief complaint on file.   HPI Tina Melendez is a 38 y.o. female.  38 year old female with a history of anemia, anxiety presents to the emergency department for adverse effects to Zoloft.  She reports taking her first dose of Zoloft around 1300 today.  Within 30 minutes she began to experience abdominal cramping as well as dizziness, nausea, and restlessness.  She states that she feels as though it made her anxiety worse.  She has not taken any medications for her symptoms.  Complains that her side effects are making it difficult for her to sleep.  She was also unable to sleep yesterday, but this was prior to taking this medication.  She has no vomiting, difficulty swallowing.  Does report persistent palpitations, but has been experiencing this for a while.  The history is provided by the patient. No language interpreter was used.    Past Medical History:  Diagnosis Date  . Anemia   . Fibroid uterus     Patient Active Problem List   Diagnosis Date Noted  . Anemia 11/02/2018  . Fibroid 11/02/2018    Past Surgical History:  Procedure Laterality Date  . DILATION AND CURETTAGE OF UTERUS     miscarriage     OB History   No obstetric history on file.      Home Medications    Prior to Admission medications   Medication Sig Start Date End Date Taking? Authorizing Provider  ferrous sulfate 325 (65 FE) MG EC tablet Take 1 tablet (325 mg total) by mouth 2 (two) times daily. 11/02/18  Yes Koberlein, Junell C, MD  magnesium oxide (MAG-OX) 400 MG tablet Take 400 mg by mouth daily.   Yes [provider]  Multiple Vitamin (MULTIVITAMIN WITH MINERALS) TABS tablet Take 1 tablet by mouth daily.   Yes [provider]  Probiotic Product (PROBIOTIC-10 PO) Take 1 tablet by mouth 2 (two) times daily.   Yes [provider]  sertraline (ZOLOFT) 25 MG tablet Take 1 tablet (25 mg total) by mouth daily. 11/09/18  Yes Martinique, Betty G, MD  benzonatate (TESSALON) 200 MG capsule Take 1 capsule (200 mg total) by mouth 3 (three) times daily as needed. 11/07/18   Burchette, Alinda Sierras, MD  hydrOXYzine (ATARAX/VISTARIL) 25 MG tablet Take 1 tablet (25 mg total) by mouth 3 (three) times daily as needed for itching. 11/09/18   Martinique, Betty G, MD  ondansetron (ZOFRAN ODT) 4 MG disintegrating tablet Take 1 tablet (4 mg total) by mouth every 8 (eight) hours as needed for nausea or vomiting. 11/10/18   Antonietta Breach, PA-C    Family History Family History  Problem Relation Age of Onset  . Alcohol abuse Mother   . Arthritis Mother   . Arthritis Sister   . Alcohol abuse Brother   . Asthma Brother   . Sickle cell anemia Maternal Grandmother   . Breast cancer Paternal Grandmother     Social History Social History   Tobacco Use  . Smoking status: Never Smoker  . Smokeless tobacco: Never Used  Substance Use Topics  . Alcohol use: Not Currently    Comment: occ  . Drug use: Never     Allergies   Macrobid [nitrofurantoin macrocrystal]   Review of Systems Review of Systems Ten systems reviewed and are negative for acute change, except as  noted in the HPI.    Physical Exam Updated Vital Signs BP (!) 141/91   Pulse 79   Temp 98.3 F (36.8 C) (Oral)   Resp 16   Ht 5\' 7"  (1.702 m)   Wt 78.4 kg   LMP 11/08/2018 (Approximate)   SpO2 99%   BMI 27.07 kg/m   Physical Exam Vitals signs and nursing note reviewed.  Constitutional:      General: She is not in acute distress.    Appearance: She is well-developed. She is not diaphoretic.     Comments: Nontoxic appearing and in NAD  HENT:     Head: Normocephalic and atraumatic.     Mouth/Throat:     Comments: No angioedema.  Patient tolerating secretions without difficulty. Eyes:     General: No scleral icterus.    Conjunctiva/sclera: Conjunctivae normal.    Neck:     Musculoskeletal: Normal range of motion.  Cardiovascular:     Rate and Rhythm: Normal rate and regular rhythm.     Pulses: Normal pulses.     Comments: No aberrant heartbeat on auscultation Pulmonary:     Effort: Pulmonary effort is normal. No respiratory distress.     Breath sounds: No stridor. No wheezing or rales.     Comments: Lungs CTAB. Respirations even and unlabored. Musculoskeletal: Normal range of motion.  Skin:    General: Skin is warm and dry.     Coloration: Skin is not pale.     Findings: No erythema or rash.  Neurological:     Mental Status: She is alert and oriented to person, place, and time.     Comments: Speech is clear, goal oriented.  Patient answers questions appropriately and follows commands.  Psychiatric:        Behavior: Behavior normal.      ED Treatments / Results  Labs (all labs ordered are listed, but only abnormal results are displayed) Labs Reviewed - No data to display  EKG None  Radiology Dg Chest 2 View  Result Date: 11/08/2018 CLINICAL DATA:  Shortness of Breath EXAM: CHEST - 2 VIEW COMPARISON:  10/29/2018 FINDINGS: The heart size and mediastinal contours are within normal limits. Both lungs are clear. The visualized skeletal structures are unremarkable. IMPRESSION: No active cardiopulmonary disease. Electronically Signed   By: Inez Catalina M.D.   On: 11/08/2018 16:45   Ct Angio Chest Pe W/cm &/or Wo Cm  Result Date: 11/08/2018 CLINICAL DATA:  Chest pressure and shortness of breath today. Anemia. Uterine fibroids. EXAM: CT ANGIOGRAPHY CHEST CT ABDOMEN AND PELVIS WITH CONTRAST TECHNIQUE: Multidetector CT imaging of the chest was performed using the standard protocol during bolus administration of intravenous contrast. Multiplanar CT image reconstructions and MIPs were obtained to evaluate the vascular anatomy. Multidetector CT imaging of the abdomen and pelvis was performed using the standard protocol during bolus administration of  intravenous contrast. CONTRAST:  153mL ISOVUE-370 IOPAMIDOL (ISOVUE-370) INJECTION 76% COMPARISON:  None. FINDINGS: CTA CHEST FINDINGS Cardiovascular: Suboptimally opacified pulmonary arteries with no pulmonary arterial filling defects seen. Normal appearing aorta. Normal sized heart. Mediastinum/Nodes: No enlarged mediastinal, hilar, or axillary lymph nodes. Thyroid gland, trachea, and esophagus demonstrate no significant findings. Lungs/Pleura: Lungs are clear. No pleural effusion or pneumothorax. Musculoskeletal: Mild thoracic spine degenerative changes. Mild sternomanubrial joint degenerative changes. Review of the MIP images confirms the above findings. CT ABDOMEN and PELVIS FINDINGS Hepatobiliary: No focal liver abnormality is seen. No gallstones, gallbladder wall thickening, or biliary dilatation. Pancreas: Unremarkable. No pancreatic ductal dilatation or  surrounding inflammatory changes. Spleen: Normal in size without focal abnormality. Adrenals/Urinary Tract: Adrenal glands are unremarkable. Kidneys are normal, without renal calculi, focal lesion, or hydronephrosis. Bladder is unremarkable. Stomach/Bowel: Stomach is within normal limits. Appendix appears normal. No evidence of bowel wall thickening, distention, or inflammatory changes. The appendix is retrocecal in location. Vascular/Lymphatic: No significant vascular findings are present. No enlarged abdominal or pelvic lymph nodes. Reproductive: Multiple rounded, heterogeneous uterine masses. The largest is an exophytic mass on the right containing coarse calcifications. This mass measures 7.9 x 6.8 cm on image number 57 series 4 and 9.3 cm in length on sagittal image number 42 series 14. The intrauterine masses are deforming and displacing the endometrial cavity. Several left ovarian cysts are demonstrated. The largest measures 4.0 cm in maximum diameter on coronal image number 52. The right ovary is difficult to differentiate from the uterine masses.  Other: No abdominal wall hernia or abnormality. No abdominopelvic ascites. Musculoskeletal: Minimal lumbar spine degenerative changes. IMPRESSION: 1. Suboptimally opacified pulmonary arteries with no pulmonary emboli seen. 2. No acute abnormality in the chest, abdomen or pelvis. 3. Multiple uterine fibroids, including multiple submucosal fibroids. 4. Several left ovarian cysts, the largest measuring 4.0 cm. This is almost certainly benign, and no specific imaging follow up is recommended according to the Society of Radiologists in Ultrasound 2010 Consensus Conference Statement (D Clovis Riley et al. Management of Asymptomatic Ovarian and Other Adnexal Cysts Imaged at [place]: Society of Radiologists in [place] Statement 2010. Radiology 256 (Sept 2010): 324-401.). Electronically Signed   By: Claudie Revering M.D.   On: 11/08/2018 19:49   Ct Abdomen Pelvis W Contrast  Result Date: 11/08/2018 CLINICAL DATA:  Chest pressure and shortness of breath today. Anemia. Uterine fibroids. EXAM: CT ANGIOGRAPHY CHEST CT ABDOMEN AND PELVIS WITH CONTRAST TECHNIQUE: Multidetector CT imaging of the chest was performed using the standard protocol during bolus administration of intravenous contrast. Multiplanar CT image reconstructions and MIPs were obtained to evaluate the vascular anatomy. Multidetector CT imaging of the abdomen and pelvis was performed using the standard protocol during bolus administration of intravenous contrast. CONTRAST:  138mL ISOVUE-370 IOPAMIDOL (ISOVUE-370) INJECTION 76% COMPARISON:  None. FINDINGS: CTA CHEST FINDINGS Cardiovascular: Suboptimally opacified pulmonary arteries with no pulmonary arterial filling defects seen. Normal appearing aorta. Normal sized heart. Mediastinum/Nodes: No enlarged mediastinal, hilar, or axillary lymph nodes. Thyroid gland, trachea, and esophagus demonstrate no significant findings. Lungs/Pleura: Lungs are clear. No pleural effusion or pneumothorax.  Musculoskeletal: Mild thoracic spine degenerative changes. Mild sternomanubrial joint degenerative changes. Review of the MIP images confirms the above findings. CT ABDOMEN and PELVIS FINDINGS Hepatobiliary: No focal liver abnormality is seen. No gallstones, gallbladder wall thickening, or biliary dilatation. Pancreas: Unremarkable. No pancreatic ductal dilatation or surrounding inflammatory changes. Spleen: Normal in size without focal abnormality. Adrenals/Urinary Tract: Adrenal glands are unremarkable. Kidneys are normal, without renal calculi, focal lesion, or hydronephrosis. Bladder is unremarkable. Stomach/Bowel: Stomach is within normal limits. Appendix appears normal. No evidence of bowel wall thickening, distention, or inflammatory changes. The appendix is retrocecal in location. Vascular/Lymphatic: No significant vascular findings are present. No enlarged abdominal or pelvic lymph nodes. Reproductive: Multiple rounded, heterogeneous uterine masses. The largest is an exophytic mass on the right containing coarse calcifications. This mass measures 7.9 x 6.8 cm on image number 57 series 4 and 9.3 cm in length on sagittal image number 42 series 14. The intrauterine masses are deforming and displacing the endometrial cavity. Several left ovarian cysts are demonstrated. The largest measures 4.0  cm in maximum diameter on coronal image number 52. The right ovary is difficult to differentiate from the uterine masses. Other: No abdominal wall hernia or abnormality. No abdominopelvic ascites. Musculoskeletal: Minimal lumbar spine degenerative changes. IMPRESSION: 1. Suboptimally opacified pulmonary arteries with no pulmonary emboli seen. 2. No acute abnormality in the chest, abdomen or pelvis. 3. Multiple uterine fibroids, including multiple submucosal fibroids. 4. Several left ovarian cysts, the largest measuring 4.0 cm. This is almost certainly benign, and no specific imaging follow up is recommended according to  the Society of Radiologists in Ultrasound 2010 Consensus Conference Statement (D Clovis Riley et al. Management of Asymptomatic Ovarian and Other Adnexal Cysts Imaged at [place]: Society of Radiologists in [place] Statement 2010. Radiology 256 (Sept 2010): 326-712.). Electronically Signed   By: Claudie Revering M.D.   On: 11/08/2018 19:49    Procedures Procedures (including critical care time)  Medications Ordered in ED Medications  ondansetron (ZOFRAN-ODT) disintegrating tablet 8 mg (8 mg Oral Given 11/10/18 0129)     Initial Impression / Assessment and Plan / ED Course  I have reviewed the triage vital signs and the nursing notes.  Pertinent labs & imaging results that were available during my care of the patient were reviewed by me and considered in my medical decision making (see chart for details).     38 year old female presents to the ED after taking her first dose of Zoloft, reporting intolerance due to side effects.  She notes abdominal cramping with dizziness and restlessness.  She feels that it has worsened her anxiety.  Patient initially questioned use of IV fluids to try and help metabolize this medication quicker.  I have explained to the patient that this will not change the time it takes for her to metabolize this medicine.  I have offered the patient medications for management of her side effects.  She is open to taking Zofran for nausea, but declines use of Ativan for anxiety management.  Have counseled on the use of over-the-counter sleep aids such as Benadryl and Unisom as the restlessness is making it difficult for her to sleep.  Patient told to discontinue Zoloft until she consults her primary doctor.  Return precautions discussed and provided. Patient discharged in stable condition with no unaddressed concerns.   Final Clinical Impressions(s) / ED Diagnoses   Final diagnoses:  Adverse effect of drug, initial encounter    ED Discharge Orders         Ordered      ondansetron (ZOFRAN ODT) 4 MG disintegrating tablet  Every 8 hours PRN     11/10/18 0144           Antonietta Breach, PA-C 11/10/18 0432    Antonietta Breach, PA-C 11/10/18 0434    Merrily Pew, MD 11/10/18 [phone]

## 2018-11-11 NOTE — Telephone Encounter (Signed)
Please check in with her today by phone. Looks like she was in ED yesterday with anxiety response sx to starting zoloft. How is she feeling today?

## 2018-11-15 NOTE — Telephone Encounter (Signed)
RN attempted to contact patient. No answer. Left message for patient to return call.

## 2018-11-16 NOTE — Telephone Encounter (Signed)
Can you try to reach her again today? Not sure why I can't see holter report? Just wanting to make sure she is feeling better since her ER visit. She does have appointments in next week with hematology and cardiology which is good, but just want to touch base before these.

## 2018-11-16 NOTE — Telephone Encounter (Signed)
Left message for patient to call back. CRM created 

## 2018-11-17 ENCOUNTER — Inpatient Hospital Stay: Payer: 59 | Attending: Internal Medicine | Admitting: Internal Medicine

## 2018-11-21 ENCOUNTER — Ambulatory Visit: Payer: Self-pay

## 2018-11-21 ENCOUNTER — Emergency Department (HOSPITAL_COMMUNITY)
Admission: EM | Admit: 2018-11-21 | Discharge: 2018-11-21 | Disposition: A | Discharge status: Home / Self Care | Payer: 59 | Attending: Emergency Medicine | Admitting: Emergency Medicine

## 2018-11-21 ENCOUNTER — Encounter (HOSPITAL_COMMUNITY): Payer: Self-pay

## 2018-11-21 ENCOUNTER — Emergency Department (HOSPITAL_COMMUNITY): Payer: 59

## 2018-11-21 ENCOUNTER — Other Ambulatory Visit: Payer: Self-pay

## 2018-11-21 DIAGNOSIS — R05 Cough: Secondary | ICD-10-CM | POA: Diagnosis present

## 2018-11-21 DIAGNOSIS — J069 Acute upper respiratory infection, unspecified: Secondary | ICD-10-CM | POA: Insufficient documentation

## 2018-11-21 DIAGNOSIS — M5412 Radiculopathy, cervical region: Secondary | ICD-10-CM | POA: Insufficient documentation

## 2018-11-21 DIAGNOSIS — Z79899 Other long term (current) drug therapy: Secondary | ICD-10-CM | POA: Insufficient documentation

## 2018-11-21 MED ORDER — METHOCARBAMOL 500 MG PO TABS: 500.0000 mg | ORAL_TABLET | Freq: Two times a day (BID) | ORAL | 0 refills | Status: DC

## 2018-11-21 NOTE — Discharge Instructions (Addendum)
Your x-rays today are normal.  Follow up with your primary care provider. Take Robaxin as prescribed, apply warm compresses to neck/left shoulder for 30 minutes at a time.

## 2018-11-21 NOTE — Telephone Encounter (Signed)
Will send to PCP as Tina Melendez

## 2018-11-21 NOTE — ED Triage Notes (Signed)
Pt states left arm numbness since last week. Pt states that it comes and goes. Pt states cough as well.

## 2018-11-21 NOTE — ED Provider Notes (Signed)
[facility] DEPT Provider Note   CSN: [id number] Arrival date & time: 11/21/18  1558     History   Chief Complaint Chief Complaint  Patient presents with  . Numbness  . Cough    HPI Tina Melendez is a 38 y.o. female.  38 year old female presents with complaint of cough x1/2 months, reports she had congestion with this initially however the congestion is resolved, continues to have a cough which is not productive however she is able to cough up clear mucus.  Patient denies fevers or chills associated with this.  No history of asthma, non-smoker, no history of chronic lung disease.  Patient also reports tingling sensation to her entire left arm from fingertips to shoulders, intermittent, pain with movement and palpation to left arm, denies weakness of the arm, falls or injuries.  No other complaints or concerns.     Past Medical History:  Diagnosis Date  . Anemia   . Fibroid uterus     Patient Active Problem List   Diagnosis Date Noted  . Anemia 11/02/2018  . Fibroid 11/02/2018    Past Surgical History:  Procedure Laterality Date  . DILATION AND CURETTAGE OF UTERUS     miscarriage     OB History   No obstetric history on file.      Home Medications    Prior to Admission medications   Medication Sig Start Date End Date Taking? Authorizing Provider  benzonatate (TESSALON) 200 MG capsule Take 1 capsule (200 mg total) by mouth 3 (three) times daily as needed. 11/07/18   Burchette, Alinda Sierras, MD  ferrous sulfate 325 (65 FE) MG EC tablet Take 1 tablet (325 mg total) by mouth 2 (two) times daily. 11/02/18   Caren Macadam, MD  hydrOXYzine (ATARAX/VISTARIL) 25 MG tablet Take 1 tablet (25 mg total) by mouth 3 (three) times daily as needed for itching. 11/09/18   Martinique, Betty G, MD  magnesium oxide (MAG-OX) 400 MG tablet Take 400 mg by mouth daily.    [provider]  methocarbamol (ROBAXIN) 500 MG tablet Take 1 tablet (500  mg total) by mouth 2 (two) times daily. 11/21/18   Tacy Learn, PA-C  Multiple Vitamin (MULTIVITAMIN WITH MINERALS) TABS tablet Take 1 tablet by mouth daily.    [provider]  ondansetron (ZOFRAN ODT) 4 MG disintegrating tablet Take 1 tablet (4 mg total) by mouth every 8 (eight) hours as needed for nausea or vomiting. 11/10/18   Antonietta Breach, PA-C  Probiotic Product (PROBIOTIC-10 PO) Take 1 tablet by mouth 2 (two) times daily.    [provider]  sertraline (ZOLOFT) 25 MG tablet Take 1 tablet (25 mg total) by mouth daily. 11/09/18   Martinique, Betty G, MD    Family History Family History  Problem Relation Age of Onset  . Alcohol abuse Mother   . Arthritis Mother   . Arthritis Sister   . Alcohol abuse Brother   . Asthma Brother   . Sickle cell anemia Maternal Grandmother   . Breast cancer Paternal Grandmother     Social History Social History   Tobacco Use  . Smoking status: Never Smoker  . Smokeless tobacco: Never Used  Substance Use Topics  . Alcohol use: Not Currently    Comment: occ  . Drug use: Never     Allergies   Macrobid [nitrofurantoin macrocrystal]   Review of Systems Review of Systems  Constitutional: Negative for chills and fever.  Musculoskeletal: Positive for myalgias.  Negative for arthralgias, joint swelling and neck pain.  Skin: Negative for rash and wound.  Allergic/Immunologic: Negative for immunocompromised state.  Neurological: Positive for numbness. Negative for weakness.  Psychiatric/Behavioral: Negative for confusion.  All other systems reviewed and are negative.    Physical Exam Updated Vital Signs BP 117/84 (BP Location: Left Arm)   Pulse 79   Temp 98.8 F (37.1 C) (Oral)   Resp 16   Ht 5\' 7"  (1.702 m)   Wt 78 kg   LMP 11/08/2018 (Approximate)   SpO2 98%   BMI 26.93 kg/m   Physical Exam Vitals signs and nursing note reviewed.  Constitutional:      General: She is not in acute distress.    Appearance: She is  well-developed. She is not diaphoretic.  HENT:     Head: Normocephalic and atraumatic.  Cardiovascular:     Pulses: Normal pulses.  Pulmonary:     Effort: Pulmonary effort is normal.  Musculoskeletal:        General: Tenderness present. No swelling or deformity.     Cervical back: She exhibits tenderness. She exhibits normal range of motion and no bony tenderness.       Back:  Skin:    General: Skin is warm and dry.     Findings: No erythema or rash.  Neurological:     Mental Status: She is alert and oriented to person, place, and time.     Deep Tendon Reflexes:     Reflex Scores:      Bicep reflexes are 2+ on the right side and 2+ on the left side.      Brachioradialis reflexes are 1+ on the right side and 1+ on the left side. Psychiatric:        Behavior: Behavior normal.      ED Treatments / Results  Labs (all labs ordered are listed, but only abnormal results are displayed) Labs Reviewed - No data to display  EKG None  Radiology Dg Chest 2 View  Result Date: 11/21/2018 CLINICAL DATA:  Cough, fever, shortness of breath. EXAM: CHEST - 2 VIEW COMPARISON:  Radiographs of November 08, 2018. FINDINGS: The heart size and mediastinal contours are within normal limits. Both lungs are clear. No pneumothorax or pleural effusion is noted. The visualized skeletal structures are unremarkable. IMPRESSION: No active cardiopulmonary disease. Electronically Signed   By: Marijo Conception, M.D.   On: 11/21/2018 17:04   Dg Cervical Spine Complete  Result Date: 11/21/2018 CLINICAL DATA:  LEFT arm numbness and cervical spine pain beginning last week. EXAM: CERVICAL SPINE - COMPLETE 4+ VIEW COMPARISON:  None. FINDINGS: Cervical vertebral bodies and posterior elements appear intact and aligned to the inferior endplate of C7, the most caudal well visualized level. Straightened cervical lordosis. Intervertebral disc heights preserved. No osseous neural foraminal narrowing. No destructive bony  lesions. Lateral masses in alignment. Prevertebral and paraspinal soft tissue planes are nonsuspicious. IMPRESSION: Negative cervical spine radiographs. Electronically Signed   By: Elon Alas M.D.   On: 11/21/2018 19:48    Procedures Procedures (including critical care time)  Medications Ordered in ED Medications - No data to display   Initial Impression / Assessment and Plan / ED Course  I have reviewed the triage vital signs and the nursing notes.  Pertinent labs & imaging results that were available during my care of the patient were reviewed by me and considered in my medical decision making (see chart for details).  Clinical Course as of Nov 21 2040  Mon Nov 21, 2018  2038 38yo female with cough x 1.62mos, lungs clear, CXR normal, consider bronchitis vs URI, no hx of asthma or tobacco use, recommend supportive/OTC, recheck with PCP. Also left arm tingling sensation from finger tips to shoulder, diffuse, not in dermatomal distribution, left arm diffusely TTP extending into left trapezius area. Reflexes symmetric. XR c-spine normal, recommend warm compresses and robaxin with plan to recheck with PCP for further evaluation and possible advanced imaging vs PT or neuro eval if not improving.    [LM]    Clinical Course User Index [LM] Tacy Learn, PA-C   Final Clinical Impressions(s) / ED Diagnoses   Final diagnoses:  Viral upper respiratory tract infection  Cervical radiculopathy    ED Discharge Orders         Ordered    methocarbamol (ROBAXIN) 500 MG tablet  2 times daily     11/21/18 1956           Roque Lias 11/21/18 2042    Sherwood Gambler, MD 11/21/18 2342

## 2018-11-21 NOTE — Telephone Encounter (Signed)
Pt called stating that she has tingling and numbness to her left arm.  She states that she also has a sensation of air trapped in her chest on the left side near her heart.  She states that she has had some health issues that have made her stress level rise. She denies other neurological symptoms. She states that she has been dealing with the symptoms since last Tuesday. Per protocol pt will be seen in the ER today for evaluation. Care advice read to patient. Pt verbalized understanding of all instructions.  Reason for Disposition . Patient sounds very sick or weak to the triager  Answer Assessment - Initial Assessment Questions 1. SYMPTOM: "What is the main symptom you are concerned about?" (e.g., weakness, numbness)     Numbness left arm 2. ONSET: "When did this start?" (minutes, hours, days; while sleeping)     Last tuesday 3. LAST NORMAL: "When was the last time you were normal (no symptoms)?"     before tuesday 4. PATTERN "Does this come and go, or has it been constant since it started?"  "Is it present now?"    Consistent is present now 5. CARDIAC SYMPTOMS: "Have you had any of the following symptoms: chest pain, difficulty breathing, palpitations?"     Left side feels like air in chest 6. NEUROLOGIC SYMPTOMS: "Have you had any of the following symptoms: headache, dizziness, vision loss, double vision, changes in speech, unsteady on your feet?"     dizziness 7. OTHER SYMPTOMS: "Do you have any other symptoms?"     No 8. PREGNANCY: "Is there any chance you are pregnant?" "When was your last menstrual period?"     No last month  Protocols used: NEUROLOGIC DEFICIT-A-AH

## 2018-11-22 ENCOUNTER — Telehealth: Payer: Self-pay | Admitting: Family Medicine

## 2018-11-22 NOTE — Telephone Encounter (Signed)
-----   Message from Earnestine Mealing sent at 11/15/2018 10:59 AM EST ----- Regarding: FW: HOLTER Good morning, just letting you know that this patient had an appt on 11-15-18 to get her monitor and she cancelled it on 11-14-18 stating that she was going to be out of town and will call to [place].  I will keep a watch on this and will call her in a few days if she does not call us first.  [facility] all is well with you :) ----- Message ----- From: Armando Gang Sent: 11/15/2018  10:47 AM EST To: Ranelle Oyster Price Subject: FW: HOLTER                                     See below  ----- Message ----- From: Carney Bern Sent: 11/14/2018  12:16 PM EST To: Armando Gang Subject: HOLTER                                         Order for Valerie Roys [[id number]] Order #: [id number] Procedure: HOLTER MONITOR - 66 HOUR Order Date: 11/02/2018 Proc Category: Cup Cardiac Priority: Routine Class: Ancillary Performed Standing Status: Future Expires: 11/02/2028 Status:  Ordering User: Caren Macadam, MD Department: [facility] Auth Provider: Riki Rusk Provider: Caren Macadam, MD Diagnosis: Palpitations   Sched Instruct:  Visit Types: HOLTER MONITOR-48 HOUR CUPID Comment:  Order Specific Questions Question Answer Comment  Where should this test be performed? [facility]

## 2018-11-23 NOTE — Telephone Encounter (Signed)
Patient states she went to the ER. She is still having tingling in the arm. Patient states she is not taking zoloft, it kept her awake. Appointment made.

## 2018-11-23 NOTE — Telephone Encounter (Signed)
Can you please check in with her and see how feeling now?

## 2018-11-25 ENCOUNTER — Ambulatory Visit: Payer: 59 | Admitting: Cardiovascular Disease

## 2018-11-25 ENCOUNTER — Ambulatory Visit: Payer: Self-pay | Admitting: Family Medicine

## 2018-11-25 ENCOUNTER — Encounter: Payer: Self-pay | Admitting: Family Medicine

## 2018-11-25 ENCOUNTER — Ambulatory Visit (INDEPENDENT_AMBULATORY_CARE_PROVIDER_SITE_OTHER): Payer: 59 | Admitting: Family Medicine

## 2018-11-25 ENCOUNTER — Encounter

## 2018-11-25 VITALS — BP 110/60 | HR 85 | Temp 98.3°F | Wt 172.9 lb

## 2018-11-25 DIAGNOSIS — F419 Anxiety disorder, unspecified: Secondary | ICD-10-CM | POA: Diagnosis not present

## 2018-11-25 DIAGNOSIS — D649 Anemia, unspecified: Secondary | ICD-10-CM | POA: Diagnosis not present

## 2018-11-25 MED ORDER — BUSPIRONE HCL 10 MG PO TABS: ORAL_TABLET | ORAL | 2 refills | Status: DC

## 2018-11-25 NOTE — Progress Notes (Signed)
Tina Melendez DOB: 1981-06-16 Encounter date: 11/25/2018  This is a 38 y.o. female who presents with Chief Complaint  Patient presents with  . Medication Management    atlernative for zoloft, unable to sleep,     History of present illness: Last visit with me was 11/02/18 to establish care. Since that time she had visit with Dr. Elease Hashimoto 1/13 for viral URI; 1/14 with Inda Coke w sx of palpitations, L sided numbness/tingling/chest heaviness/room spinning and was sent to ER via EMS. CT PE negative for PE; CT abd/pelvis showed multiple fibroids, ovarian cysts. Anemia was stable. Then saw Dr. Martinique 1/15 for anxiety, insomnia, depression and started on low dose sertraline and hydroxyzine. Seen back in ED 1/16 for suspected side effects from zoloft - dizziness, nausea, restlessness. In ED 1/27 with cough, tingling left arm. TTP left arm into left trap; XR c spine normal.  Zoloft made her feel really bad. Took this only once. Years ago took lexapro and felt like zombie. Quit without taper of the lexapro and had hard time with sx at that point.   Didn't follow up with cardiology. Cancelled appointment. Didn't get holter monitor.   Didn't follow up with hematology. Not sure why.   Did see obogyn but didn't follow up as suggested because she missed timing with period (they wanted to do hormonal testing) so she will follow up at next period.   Here with husband today. States she has been fearful of going to sleep. Has had a lot of anxiety about going to sleep. Afraid of waking and not going back to sleep. Afraid to be alone. Trying to get in with therapist to talk through anxiety. They are living in their own place now. Tends to get more of heart racing and anxiety; worse at night. Also anxious during day. Hard to think.   Didn't try atarax; worried about it knocking her out.   Slept well 2 nights ago. Had gone to church and felt better about this.   Has been doing ok with the iron.  Taking iron supplement twice daily; some constipation.   Second day of period changing q 1-2 hours. Has little blood clots.    Allergies  Allergen Reactions  . Macrobid [Nitrofurantoin Macrocrystal]     Made feel terrible, out of sorts, some shortness of breath   No outpatient medications have been marked as taking for the 11/25/18 encounter (Office Visit) with Caren Macadam, MD.    Review of Systems  Constitutional: Negative for chills, fatigue and fever.  Respiratory: Negative for cough, chest tightness, shortness of breath and wheezing.   Cardiovascular: Positive for palpitations (just notes when anxiety is elevated). Negative for chest pain and leg swelling.  Neurological:       Has noted that tingling in arm is just associated with anxiety exacerbations.    Objective:  BP 110/60 (BP Location: Left Arm, Patient Position: Sitting, Cuff Size: Normal)   Pulse 85   Temp 98.3 F (36.8 C) (Oral)   Wt 172 lb 14.4 oz (78.4 kg)   LMP 11/08/2018 (Approximate)   SpO2 97%   BMI 27.08 kg/m   Weight: 172 lb 14.4 oz (78.4 kg)   BP Readings from Last 3 Encounters:  11/25/18 110/60  11/21/18 117/84  11/10/18 (!) 141/91   Wt Readings from Last 3 Encounters:  11/25/18 172 lb 14.4 oz (78.4 kg)  11/21/18 171 lb 15.3 oz (78 kg)  11/10/18 172 lb 13.5 oz (78.4 kg)    Physical  Exam Constitutional:      General: She is not in acute distress.    Appearance: She is well-developed.  Cardiovascular:     Rate and Rhythm: Normal rate and regular rhythm.     Heart sounds: Normal heart sounds. No murmur. No friction rub.  Pulmonary:     Effort: Pulmonary effort is normal. No respiratory distress.     Breath sounds: Normal breath sounds. No wheezing or rales.  Musculoskeletal:     Right lower leg: No edema.     Left lower leg: No edema.  Neurological:     Mental Status: She is alert and oriented to person, place, and time.  Psychiatric:        Behavior: Behavior normal.      Assessment/Plan 1. Anemia, unspecified type Missed appt with hematology. Number given for her to follow up with them regarding anemia, low ferritin. We will recheck ferritin in 2 weeks time to make sure she is getting some improvement with oral iron.  - Ferritin; Future - Iron and TIBC; Future - CBC with Differential/Platelet; Future  2. Anxiety Trial buspar. Since she has had difficulty tolerating SSRI and prefers not to take controlled substances or antihistamines (which have caused her hyperactivity in past). We will start low dose and increase as tolerated. She is also seeking therapist which will be helpful. We discussed daily exercise to manage stress and anxiety as well as helping with sleep. She will update me with med tolerance next week.     Return pending verbal report next week.     Micheline Rough, MD

## 2018-11-25 NOTE — Patient Instructions (Addendum)
Please call hematology to reschedule missed appointment from last week.   [phone]  Update me through [employer] (or by phone) next week.   Schedule bloodwork in next 1-2 weeks

## 2018-11-28 ENCOUNTER — Telehealth: Payer: Self-pay

## 2018-11-28 NOTE — Telephone Encounter (Signed)
PA for Buspirone sent to cover my meds.   (Key: [id number])

## 2018-11-29 ENCOUNTER — Telehealth: Payer: Self-pay

## 2018-11-29 ENCOUNTER — Telehealth: Payer: Self-pay | Admitting: Hematology

## 2018-11-29 ENCOUNTER — Ambulatory Visit: Payer: Self-pay

## 2018-11-29 NOTE — Telephone Encounter (Signed)
Dr. Koberlein please advise  

## 2018-11-29 NOTE — Telephone Encounter (Signed)
Incoming call from Patient who questions if, She request her dosage of Buspar be increased from 0.5 to 10mg .  Will she feel overwhelmingly sleepy and tired.  Patient states that, she need to be able to function.  Patient does state that she is facing more stressors in her life.  Patient does have a standing appointment for  12/02/18 Fri  With Dr.  Ethlyn Gallery.  Patient states that she will discuss with her further at that appointment.    Reason for Disposition . Caller has NON-URGENT medication question about med that PCP prescribed and triager unable to answer question  Answer Assessment - Initial Assessment Questions 1. SYMPTOMS: "Do you have any symptoms?"    Anxiety level higher.  2. SEVERITY: If symptoms are present, ask "Are they mild, moderate or severe?"     *No Answer*  Protocols used: MEDICATION QUESTION CALL-A-AH

## 2018-11-29 NOTE — Telephone Encounter (Signed)
received PA forms for Buspirone. Forms have been completed and faxed back.

## 2018-11-29 NOTE — Telephone Encounter (Signed)
   Answer Assessment - Initial Assessment Questions 1. SYMPTOMS: "Do you have any symptoms?"    Anxiety level higher.  2. SEVERITY: If symptoms are present, ask "Are they mild, moderate or severe?"     *No Answer*  Protocols used: MEDICATION QUESTION CALL-A-AH

## 2018-11-29 NOTE — Telephone Encounter (Signed)
Will send to Dr. Ethlyn Gallery.

## 2018-11-29 NOTE — Telephone Encounter (Signed)
Pt has been rescheduled to see Dr. Irene Limbo on 2/27 at 1pm.

## 2018-11-29 NOTE — Telephone Encounter (Signed)
Copied from [place] [id number]. Topic: General - Other >> Nov 28, 2018  2:50 PM Virl Axe D wrote: Reason for CRM: Pt stated Dr. Ethlyn Gallery asked her to report how she is doing on new medication busPIRone (BUSPAR) 10 MG tablet. Pt stated it is going fine. She takes medication at 8am and then again at 3 or 4p. She stated the pharmacy instructed her to take them 8 hours apart. She noticed her anxiety kicks in at night and is a little more intense. Please advise.

## 2018-11-30 ENCOUNTER — Encounter: Payer: Self-pay | Admitting: Family Medicine

## 2018-11-30 NOTE — Telephone Encounter (Signed)
Noted  

## 2018-11-30 NOTE — Telephone Encounter (Signed)
Please advise 

## 2018-11-30 NOTE — Telephone Encounter (Signed)
Since she is doing ok with the medication; have her take the half tab in morning as she has been, but then take a full tab for the afternoon dose. Let's try this through weekend and see if that is more helpful for her. Remember, we can also increase to three times daily (doesn't have to be 8 hours apart) if needed. This might give her a little more help for evening if taking dose right at bedtime. I would suggest trying whole tab first for that second dose, and then update me again next week with progress. Sooner if any concerns. Please let her know I did try to send her a message this weekend and it didn't seem mychart was set up so I'm glad she called.

## 2018-11-30 NOTE — Telephone Encounter (Signed)
Relayed information to pt per notes of Dr. Ethlyn Gallery on 11/30/18. Pt verbalized understanding and wants to know if she can take the 1/2 tab three times a day versus taking 1/2 tab in the morning and one whole tab in the afternoon. Pt also has appt scheduled on 12/02/18. Pt states that she now has MyChart account set up.

## 2018-11-30 NOTE — Telephone Encounter (Signed)
Sorry didn't see other message with this.   I do not expect her to feel fatigued with an increase in the buspar dose. Please see other message. If she does have fatigue, let me know.

## 2018-11-30 NOTE — Telephone Encounter (Signed)
I addressed through [employer]. OK to take 1/2 tab TID and encouraged her to contact me next week with update. Should not need anything further.

## 2018-11-30 NOTE — Telephone Encounter (Signed)
Left message for patient to call back. CRM created 

## 2018-11-30 NOTE — Telephone Encounter (Signed)
noted 

## 2018-12-01 ENCOUNTER — Other Ambulatory Visit: Payer: Self-pay

## 2018-12-01 ENCOUNTER — Telehealth: Payer: Self-pay

## 2018-12-01 ENCOUNTER — Encounter (HOSPITAL_COMMUNITY): Payer: Self-pay | Admitting: *Deleted

## 2018-12-01 ENCOUNTER — Ambulatory Visit: Payer: Self-pay

## 2018-12-01 ENCOUNTER — Inpatient Hospital Stay (HOSPITAL_COMMUNITY)
Admission: AD | Admit: 2018-12-01 | Discharge: 2018-12-01 | Disposition: A | Discharge status: Home / Self Care | Payer: 59 | Attending: Obstetrics & Gynecology | Admitting: Obstetrics & Gynecology

## 2018-12-01 DIAGNOSIS — F419 Anxiety disorder, unspecified: Secondary | ICD-10-CM | POA: Diagnosis not present

## 2018-12-01 DIAGNOSIS — N939 Abnormal uterine and vaginal bleeding, unspecified: Secondary | ICD-10-CM

## 2018-12-01 DIAGNOSIS — D649 Anemia, unspecified: Secondary | ICD-10-CM | POA: Insufficient documentation

## 2018-12-01 DIAGNOSIS — D508 Other iron deficiency anemias: Secondary | ICD-10-CM

## 2018-12-01 DIAGNOSIS — R42 Dizziness and giddiness: Secondary | ICD-10-CM | POA: Diagnosis not present

## 2018-12-01 LAB — URINALYSIS, ROUTINE W REFLEX MICROSCOPIC
Bilirubin Urine: NEGATIVE
Glucose, UA: NEGATIVE mg/dL
Ketones, ur: NEGATIVE mg/dL
Nitrite: NEGATIVE
Protein, ur: NEGATIVE mg/dL
Specific Gravity, Urine: <1.005 — ABNORMAL LOW (ref 1.005–1.030)
pH: 6.5 (ref 5.0–8.0)

## 2018-12-01 LAB — CBC WITH DIFFERENTIAL/PLATELET
BASOS ABS: 0 10*3/uL (ref 0.0–0.1)
Basophils Relative: 1 %
Eosinophils Absolute: 0.2 10*3/uL (ref 0.0–0.5)
Eosinophils Relative: 3 %
HCT: 36.1 % (ref 36.0–46.0)
Hemoglobin: 11.4 g/dL — ABNORMAL LOW (ref 12.0–15.0)
Lymphocytes Relative: 23 %
Lymphs Abs: 1.7 10*3/uL (ref 0.7–4.0)
MCH: 20.7 pg — ABNORMAL LOW (ref 26.0–34.0)
MCHC: 31.6 g/dL (ref 30.0–36.0)
MCV: 65.4 fL — ABNORMAL LOW (ref 80.0–100.0)
Monocytes Absolute: 0.2 10*3/uL (ref 0.1–1.0)
Monocytes Relative: 3 %
Neutro Abs: 5.2 10*3/uL (ref 1.7–7.7)
Neutrophils Relative %: 70 %
Platelets: 196 10*3/uL (ref 150–400)
RBC: 5.52 MIL/uL — AB (ref 3.87–5.11)
RDW: 22.5 % — ABNORMAL HIGH (ref 11.5–15.5)
WBC: 7.3 10*3/uL (ref 4.0–10.5)
nRBC: 0 % (ref 0.0–0.2)

## 2018-12-01 LAB — COMPREHENSIVE METABOLIC PANEL
ALT: 14 U/L (ref 0–44)
AST: 15 U/L (ref 15–41)
Albumin: 4 g/dL (ref 3.5–5.0)
Alkaline Phosphatase: 55 U/L (ref 38–126)
Anion gap: 7 (ref 5–15)
BUN: 11 mg/dL (ref 6–20)
CO2: 25 mmol/L (ref 22–32)
Calcium: 9.1 mg/dL (ref 8.9–10.3)
Chloride: 105 mmol/L (ref 98–111)
Creatinine, Ser: 0.62 mg/dL (ref 0.44–1.00)
GFR calc Af Amer: >60 mL/min (ref >60)
GFR calc non Af Amer: >60 mL/min (ref >60)
Glucose, Bld: 98 mg/dL (ref 70–99)
Potassium: 4.1 mmol/L (ref 3.5–5.1)
Sodium: 137 mmol/L (ref 135–145)
Total Bilirubin: 0.6 mg/dL (ref 0.3–1.2)
Total Protein: 7.2 g/dL (ref 6.5–8.1)

## 2018-12-01 LAB — URINALYSIS, MICROSCOPIC (REFLEX): RBC / HPF: >50 RBC/hpf (ref 0–5)

## 2018-12-01 MED ORDER — DEXTROSE IN LACTATED RINGERS 5 % IV SOLN
Freq: Once | INTRAVENOUS | Status: AC
  Administered 2018-12-01: 16:00:00 via INTRAVENOUS

## 2018-12-01 NOTE — Telephone Encounter (Signed)
Pt c/o 2 days of moderate vaginal bleeding. Pt is soaking 1-2 pads per hour and is passing small clots. Pt is having abdominal pain that she rates as moderate. Pt stated that she has a history of fibroids. Pt very anxious. Emotional support provided. Pt stated that she passed out at the facility where they drew her blood. Pt given care advice and pt verbalized understanding. Pt advised to go to [facility] for evaluation.  Reason for Disposition . SEVERE vaginal bleeding (i.e., soaking 2 pads or tampons per hour and present 2 or more hours; 1 menstrual cup every 2 hours)  Answer Assessment - Initial Assessment Questions 1. AMOUNT: "Describe the bleeding that you are having."    - SPOTTING: spotting, or pinkish / brownish mucous discharge; does not fill panti-liner or pad    - MILD:  less than 1 pad / hour; less than patient's usual menstrual bleeding   - MODERATE: 1-2 pads / hour; 1 menstrual cup every 6 hours; small-medium blood clots (e.g., pea, grape, small coin)   - SEVERE: soaking 2 or more pads/hour for 2 or more hours; 1 menstrual cup every 2 hours; bleeding not contained by pads or continuous red blood from vagina; large blood clots (e.g., golf ball, large coin)      Moderate small blood clots 2. ONSET: "When did the bleeding begin?" "Is it continuing now?"     2 days ago 3. MENSTRUAL PERIOD: "When was the last normal menstrual period?" "How is this different than your period?"     3 months ago- this period very heavy 4. REGULARITY: "How regular are your periods?"     irregular 5. ABDOMINAL PAIN: "Do you have any pain?" "How bad is the pain?"  (e.g., Scale 1-10; mild, moderate, or severe)   - MILD (1-3): doesn't interfere with normal activities, abdomen soft and not tender to touch    - MODERATE (4-7): interferes with normal activities or awakens from sleep, tender to touch    - SEVERE (8-10): excruciating pain, doubled over, unable to do any normal activities      Moderate-  sore 6. PREGNANCY: "Could you be pregnant?" "Are you sexually active?" "Did you recently give birth?"     No- yes-no 7. BREASTFEEDING: "Are you breastfeeding?"     no 8. HORMONES: "Are you taking any hormone medications, prescription or OTC?" (e.g., birth control pills, estrogen)     no 9. BLOOD THINNERS: "Do you take any blood thinners?" (e.g., Coumadin/warfarin, Pradaxa/dabigatran, aspirin)     no 10. CAUSE: "What do you think is causing the bleeding?" (e.g., recent gyn surgery, recent gyn procedure; known bleeding disorder, cervical cancer, polycystic ovarian disease, fibroids)         Pt has fibroids 11. HEMODYNAMIC STATUS: "Are you weak or feeling lightheaded?" If so, ask: "Can you stand and walk normally?"        Yes- yes 12. OTHER SYMPTOMS: "What other symptoms are you having with the bleeding?" (e.g., passed tissue, vaginal discharge, fever, menstrual-type cramps)       no  Protocols used: VAGINAL BLEEDING - ABNORMAL-A-AH

## 2018-12-01 NOTE — Telephone Encounter (Signed)
Will monitor for [facility] ED arrival.

## 2018-12-01 NOTE — Telephone Encounter (Signed)
Noted. Looks like she is still in ER. Will follow along with notes.

## 2018-12-01 NOTE — Telephone Encounter (Signed)
Copied from [place] [id number]. Topic: General - Other >> Dec 01, 2018 11:11 AM Tina Melendez wrote: Reason for CRM: patient is calling to state yesterday at  her OBGYN appt she had blood work drawn and the patient passed out, she stated she is on her cycle and she doesn't feel that having blood drawn tomorrow for her appt with Dr. Ethlyn Gallery is a good idea. Please advise

## 2018-12-01 NOTE — Discharge Instructions (Signed)

## 2018-12-01 NOTE — Telephone Encounter (Signed)
She ended up in ER today. When they send me her summary I will get in touch with her regarding bloodwork. No rush as I'm sure they will evaluate her anemia. If you see that she has been discharged from ER you can check back in with her and see how she is doing.

## 2018-12-01 NOTE — Telephone Encounter (Signed)
Pt has arrived [facility] ED.

## 2018-12-01 NOTE — MAU Note (Signed)
Heavy bleeding, started yesterday.  Cycles are pretty heavy.  Has anemia, dealing with anxiety too.  Little pain on rt side, where fibroids are.  Was seen at [place] office today.

## 2018-12-01 NOTE — Telephone Encounter (Signed)
Dr. Ethlyn Gallery please advise. Pt has an appointment tomorrow.

## 2018-12-01 NOTE — MAU Provider Note (Signed)
History     CSN: [id number]  Arrival date and time: 12/01/18 1408   First Provider Initiated Contact with Patient 12/01/18 1446      Chief Complaint  Patient presents with  . Vaginal Bleeding   HPI Tina Melendez is a 38 y.o. [phone] non-pregnant patient who presents to MAU with chief complaint of dizziness, heavy vaginal bleeding and anxiety. These are recurring problems.   Vaginal Bleeding This is a recurring problem. Patient was diagnosed with multiple fibroids and cysts 11/08/2018. She has been working with Dr Lynnette Caffey to discuss possible solutions to abnormal uterine bleeding, fibroids and cysts. She was seen in office yesterday and "had her hormone level checked". She has previously been advised to consider contraception for management of heavy and irregular periods but she and her partner desire pregnancy, so she has not pursued these options.  Dizziness This is a recurring problem which started in December. Patient states she has not been able to determine if this is associated with her anxiety or her anemia. Her dizziness is intermittent and does not seem to be associated with increased activity. She denies SOB, palpitations, weakness or syncope. She takes Iron BID for anemia but has not noticed a significant difference and does not feel it is managing her symptoms.  Anxiety This is a recurring problem. Patient states she is not sleeping, eating or drinking due to her anxiety. She has tried management with medication but finds medication makes her too sleepy so she discontinued use.  Pertinent Gynecological History: Menses: Irregular, amenorrhea for up to three months Bleeding: dysfunctional uterine bleeding Contraception: none Sexually transmitted diseases: no past history Last pap: normal Date: within past three years   Past Medical History:  Diagnosis Date  . Anemia   . Fibroid uterus     Past Surgical History:  Procedure Laterality Date  . DILATION AND  CURETTAGE OF UTERUS     miscarriage    Family History  Problem Relation Age of Onset  . Alcohol abuse Mother   . Arthritis Mother   . Arthritis Sister   . Alcohol abuse Brother   . Asthma Brother   . Sickle cell anemia Maternal Grandmother   . Breast cancer Paternal Grandmother     Social History   Tobacco Use  . Smoking status: Never Smoker  . Smokeless tobacco: Never Used  Substance Use Topics  . Alcohol use: Not Currently    Comment: occ  . Drug use: Never    Allergies:  Allergies  Allergen Reactions  . Macrobid [Nitrofurantoin Macrocrystal]     Made feel terrible, out of sorts, some shortness of breath    Medications Prior to Admission  Medication Sig Dispense Refill Last Dose  . busPIRone (BUSPAR) 10 MG tablet Start with 0.5 tab twice daily x 7 days, then increase to full tab twice daily 60 tablet 2   . ferrous sulfate 325 (65 FE) MG EC tablet Take 1 tablet (325 mg total) by mouth 2 (two) times daily. 60 tablet 2 11/09/2018 at Unknown time  . hydrOXYzine (ATARAX/VISTARIL) 25 MG tablet Take 1 tablet (25 mg total) by mouth 3 (three) times daily as needed for itching. 30 tablet 0 not started  . magnesium oxide (MAG-OX) 400 MG tablet Take 400 mg by mouth daily.   Not Taking  . Multiple Vitamin (MULTIVITAMIN WITH MINERALS) TABS tablet Take 1 tablet by mouth daily.   Past Week at Unknown time  . ondansetron (ZOFRAN ODT) 4 MG disintegrating tablet Take 1  tablet (4 mg total) by mouth every 8 (eight) hours as needed for nausea or vomiting. 5 tablet 0   . Probiotic Product (PROBIOTIC-10 PO) Take 1 tablet by mouth 2 (two) times daily.   Past Week at Unknown time    Review of Systems  Constitutional: Positive for fatigue. Negative for chills and fever.  Respiratory: Negative for cough, choking, chest tightness and shortness of breath.   Cardiovascular: Negative for chest pain.  Gastrointestinal: Positive for abdominal pain. Negative for diarrhea, nausea and vomiting.   Genitourinary: Positive for vaginal bleeding. Negative for difficulty urinating.  Musculoskeletal: Negative for back pain.  Skin: Negative for pallor.  Neurological: Positive for dizziness. Negative for syncope, weakness and headaches.  Psychiatric/Behavioral: Positive for sleep disturbance.  All other systems reviewed and are negative.  Physical Exam   Blood pressure 134/90, pulse 73, temperature 98.2 F (36.8 C), temperature source Oral, resp. rate 18, weight 77.8 kg, last menstrual period 11/07/2018, SpO2 100 %.  Physical Exam  Nursing note and vitals reviewed. Constitutional: She is oriented to person, place, and time. She appears well-developed and well-nourished.  Cardiovascular: Normal rate, normal heart sounds and intact distal pulses.  Respiratory: Effort normal and breath sounds normal.  GI: Soft. She exhibits mass. There is abdominal tenderness. There is guarding. There is no rebound.  Genitourinary:    Vagina normal.     Genitourinary Comments: Small amount dark red vaginal bleeding, removed with fox swabs x 2. Negligible additional bleeding afterwards   Musculoskeletal: Normal range of motion.  Neurological: She is alert and oriented to person, place, and time. She has normal reflexes.  Skin: Skin is warm and dry.  Psychiatric: She has a normal mood and affect. Her behavior is normal. Judgment and thought content normal.    MAU Course/MDM  Procedures: sterile speculum exam  --CT results from January reviewed --Clinic notes for visits associated with chief complaints reviewed --Patient ambulating well without signs of dizziness, SOB, dyspnea on exertion.  --Discussed possible solutions for management of anxiety including cognitive behavior therapy and low dose Vistaril. Patient declines  Patient Vitals for the past 24 hrs:  BP Temp Temp src Pulse Resp SpO2 Weight  12/01/18 1840 109/74 - - 79 18 - -  12/01/18 1446 130/84 - - 80 18 - -  12/01/18 1422 134/90 98.2 F  (36.8 C) Oral 73 18 100 % 77.8 kg    Results for orders placed or performed during the hospital encounter of 12/01/18 (from the past 24 hour(s))  CBC with Differential/Platelet     Status: Abnormal   Collection Time: 12/01/18  3:15 PM  Result Value Ref Range   WBC 7.3 4.0 - 10.5 K/uL   RBC 5.52 (H) 3.87 - 5.11 MIL/uL   Hemoglobin 11.4 (L) 12.0 - 15.0 g/dL   HCT 36.1 36.0 - 46.0 %   MCV 65.4 (L) 80.0 - 100.0 fL   MCH 20.7 (L) 26.0 - 34.0 pg   MCHC 31.6 30.0 - 36.0 g/dL   RDW 22.5 (H) 11.5 - 15.5 %   Platelets 196 150 - 400 K/uL   nRBC 0.0 0.0 - 0.2 %   Neutrophils Relative % 70 %   Neutro Abs 5.2 1.7 - 7.7 K/uL   Lymphocytes Relative 23 %   Lymphs Abs 1.7 0.7 - 4.0 K/uL   Monocytes Relative 3 %   Monocytes Absolute 0.2 0.1 - 1.0 K/uL   Eosinophils Relative 3 %   Eosinophils Absolute 0.2 0.0 - 0.5 K/uL  Basophils Relative 1 %   Basophils Absolute 0.0 0.0 - 0.1 K/uL  Comprehensive metabolic panel     Status: None   Collection Time: 12/01/18  3:15 PM  Result Value Ref Range   Sodium 137 135 - 145 mmol/L   Potassium 4.1 3.5 - 5.1 mmol/L   Chloride 105 98 - 111 mmol/L   CO2 25 22 - 32 mmol/L   Glucose, Bld 98 70 - 99 mg/dL   BUN 11 6 - 20 mg/dL   Creatinine, Ser 0.62 0.44 - 1.00 mg/dL   Calcium 9.1 8.9 - 10.3 mg/dL   Total Protein 7.2 6.5 - 8.1 g/dL   Albumin 4.0 3.5 - 5.0 g/dL   AST 15 15 - 41 U/L   ALT 14 0 - 44 U/L   Alkaline Phosphatase 55 38 - 126 U/L   Total Bilirubin 0.6 0.3 - 1.2 mg/dL   GFR calc non Af Amer >60 >60 mL/min   GFR calc Af Amer >60 >60 mL/min   Anion gap 7 5 - 15  Urinalysis, Routine w reflex microscopic     Status: Abnormal   Collection Time: 12/01/18  3:24 PM  Result Value Ref Range   Color, Urine AMBER (A) YELLOW   APPearance CLEAR CLEAR   Specific Gravity, Urine <1.005 (L) 1.005 - 1.030   pH 6.5 5.0 - 8.0   Glucose, UA NEGATIVE NEGATIVE mg/dL   Hgb urine dipstick LARGE (A) NEGATIVE   Bilirubin Urine NEGATIVE NEGATIVE   Ketones, ur NEGATIVE  NEGATIVE mg/dL   Protein, ur NEGATIVE NEGATIVE mg/dL   Nitrite NEGATIVE NEGATIVE   Leukocytes, UA TRACE (A) NEGATIVE  Urinalysis, Microscopic (reflex)     Status: Abnormal   Collection Time: 12/01/18  3:24 PM  Result Value Ref Range   RBC / HPF >50 0 - 5 RBC/hpf   WBC, UA 0-5 0 - 5 WBC/hpf   Bacteria, UA MANY (A) NONE SEEN   Squamous Epithelial / LPF 0-5 0 - 5   Mucus PRESENT     Meds ordered this encounter  Medications  . dextrose 5 % in lactated ringers infusion    Assessment and Plan  --38 y.o. [id number] --Anemia, abnormal uterine bleeding --Hemodynamically stable, scant bleeding during evaluation in MAU --Discharge home in stable condition  F/U as planned with [facility] for management of fibroids and [place], CNM 12/01/2018, 7:05 PM

## 2018-12-01 NOTE — Telephone Encounter (Signed)
Will send to Dr. Koberlein as FYI 

## 2018-12-02 ENCOUNTER — Encounter: Payer: Self-pay | Admitting: Family Medicine

## 2018-12-02 ENCOUNTER — Ambulatory Visit (INDEPENDENT_AMBULATORY_CARE_PROVIDER_SITE_OTHER): Payer: 59 | Admitting: Family Medicine

## 2018-12-02 VITALS — BP 120/70 | HR 78 | Temp 98.3°F | Wt 171.2 lb

## 2018-12-02 DIAGNOSIS — D649 Anemia, unspecified: Secondary | ICD-10-CM | POA: Diagnosis not present

## 2018-12-02 DIAGNOSIS — F419 Anxiety disorder, unspecified: Secondary | ICD-10-CM | POA: Diagnosis not present

## 2018-12-02 MED ORDER — FERROUS SULFATE 325 (65 FE) MG PO TBEC: 325.0000 mg | DELAYED_RELEASE_TABLET | Freq: Two times a day (BID) | ORAL | 2 refills | Status: AC

## 2018-12-02 NOTE — Progress Notes (Signed)
Tina Melendez DOB: Jan 20, 1981 Encounter date: 12/02/2018  This is a 38 y.o. female who presents with Chief Complaint  Patient presents with  . Follow-up    History of present illness: Patient was seen in ER yesterday for vaginal bleeding. Needs to call obgyn for follow up. They are wanting to do further testing for fibroids in uterus with dye study. Bleeding is decreased. Not changing often; able to wear same pad through day.  We had started buspar at last visit to help with anxiety. Worries about medication causing drowsiness. Almost feels like she has some vertigo sx (ie if she moves she feels like she is still moving a little). Still not sleeping well but getting more sleep than she was previously. But last night had sensation of not being able to breathe. Things do get a little more out of control prior to her cycle.   Also states that the movement sensation has not improved for her.       Allergies  Allergen Reactions  . Macrobid [Nitrofurantoin Macrocrystal]     Made feel terrible, out of sorts, some shortness of breath   Current Meds  Medication Sig  . busPIRone (BUSPAR) 10 MG tablet Start with 0.5 tab twice daily x 7 days, then increase to full tab twice daily  . ferrous sulfate 325 (65 FE) MG EC tablet Take 1 tablet (325 mg total) by mouth 2 (two) times daily.  . Multiple Vitamin (MULTIVITAMIN WITH MINERALS) TABS tablet Take 1 tablet by mouth daily.  . Probiotic Product (PROBIOTIC-10 PO) Take 1 tablet by mouth 2 (two) times daily.  . [DISCONTINUED] ferrous sulfate 325 (65 FE) MG EC tablet Take 1 tablet (325 mg total) by mouth 2 (two) times daily.  . [DISCONTINUED] hydrOXYzine (ATARAX/VISTARIL) 25 MG tablet Take 1 tablet (25 mg total) by mouth 3 (three) times daily as needed for itching.  . [DISCONTINUED] magnesium oxide (MAG-OX) 400 MG tablet Take 400 mg by mouth daily.  . [DISCONTINUED] ondansetron (ZOFRAN ODT) 4 MG disintegrating tablet Take 1 tablet (4 mg total)  by mouth every 8 (eight) hours as needed for nausea or vomiting.    Review of Systems  Constitutional: Negative for chills, fatigue and fever.  Respiratory: Negative for cough, chest tightness, shortness of breath and wheezing.   Cardiovascular: Negative for chest pain, palpitations and leg swelling.  Psychiatric/Behavioral: The patient is nervous/anxious.     Objective:  BP 120/70 (BP Location: Right Arm, Patient Position: Sitting, Cuff Size: Normal)   Pulse 78   Temp 98.3 F (36.8 C) (Oral)   Wt 171 lb 3.2 oz (77.7 kg)   LMP 11/08/2018 (Approximate)   SpO2 97%   BMI 26.81 kg/m   Weight: 171 lb 3.2 oz (77.7 kg)   BP Readings from Last 3 Encounters:  12/02/18 120/70  12/01/18 109/74  11/25/18 110/60   Wt Readings from Last 3 Encounters:  12/02/18 171 lb 3.2 oz (77.7 kg)  12/01/18 171 lb 8 oz (77.8 kg)  11/25/18 172 lb 14.4 oz (78.4 kg)    Physical Exam Constitutional:      General: She is not in acute distress.    Appearance: She is well-developed.  Cardiovascular:     Rate and Rhythm: Normal rate and regular rhythm.     Heart sounds: Normal heart sounds. No murmur. No friction rub.  Pulmonary:     Effort: Pulmonary effort is normal. No respiratory distress.     Breath sounds: Normal breath sounds. No wheezing or rales.  Musculoskeletal:     Right lower leg: No edema.     Left lower leg: No edema.  Neurological:     Mental Status: She is alert and oriented to person, place, and time.  Psychiatric:        Mood and Affect: Mood is anxious.        Behavior: Behavior normal.     Assessment/Plan 1. Anemia, unspecified type Significant improvement in anemia from previous labs to those checked in ER yesterday (Hb up 2 points). This is very reassuring. She does have appt with hematology later this month. I did encourage her to continue iron supplements to help build ferritin stores. She will schedule follow up with obgyn to discuss mgmt of fibroids while working with  patient's desired pregnancy wishes. - ferrous sulfate 325 (65 FE) MG EC tablet; Take 1 tablet (325 mg total) by mouth 2 (two) times daily.  Dispense: 60 tablet; Refill: 2  2. Anxiety She has tolerated the buspar. She would like to try whole tablet in morning and half in the afternoon. She does have therapy appointment next week. We discussed course of treatment, expectations. I expect her to have significant improvement in symptoms as she completes therapy, gets some control of ongoing medical issues, and works up to therapeutic buspar dose. She will communicate with me if any concerns/issues.  Return in about 1 month (around 12/31/2018).    Micheline Rough, MD

## 2018-12-02 NOTE — Patient Instructions (Signed)
Update me next week through mychart.

## 2018-12-05 ENCOUNTER — Encounter: Payer: Self-pay | Admitting: Family Medicine

## 2018-12-05 ENCOUNTER — Ambulatory Visit: Payer: Self-pay

## 2018-12-05 NOTE — Telephone Encounter (Signed)
Pt c/o tightness to the center of her chest that comes and goes, anxiety, pressure to the right front of her head and interrupted sleep.The first episode of chest pain started after the started taking her 3rd dose of Buspar for her anxiety on Saturday. Pt stated that the tightness can go to a pain level of 8-9 then will subside. Pt stated she is also having episodes that last 30 minutes where her face will feel hot and her face flushes. Pt stated that the tightness will go to her back at times. Pt was having no pain at time of call but later during triage she stated that she is feeling the tightness again. Pt stated that she has been very anxious and she stated she has been on different medications for it (Lexapro,Xanax and now Buspar). Pt stated that she did not have chest pain until after adding the third dose of 1/2 tablet. Of note: pt was seen in ED 10/04/18 (palpitations); 10/07/18 (insomnia); 10/21/18 (SOB); 10/29/18 (chest pain). Pt denies difficulty breathing or sweating episodes. Pt was tearful at one point during call. Emotional support given and discussed imagery, mindfulness and deep breathing.  Pt stated that does not to take a sleeping aid.  Advised pt to call back if chest pain increases in frequency, duration or severity, of the tightness lasts over 5 minutes, difficulty breathing or sweating occurs or if she becomes worse. Appointment made with PCP 12/07/18.   Reason for Disposition . [1] Chest pain(s) lasting a few seconds AND [2] persists > 3 days  Answer Assessment - Initial Assessment Questions 1. LOCATION: "Where does it hurt?"       Center of chest "tightness" 2. RADIATION: "Does the pain go anywhere else?" (e.g., into neck, jaw, arms, back)    back 3. ONSET: "When did the chest pain begin?" (Minutes, hours or days)      Saturday and Sunday 4. PATTERN "Does the pain come and go, or has it been constant since it started?"  "Does it get worse with exertion?"      Comes and goes  -no 5. DURATION: "How long does it last" (e.g., seconds, minutes, hours)     30  minutes 6. SEVERITY: "How bad is the pain?"  (e.g., Scale 1-10; mild, moderate, or severe)    - MILD (1-3): doesn't interfere with normal activities     - MODERATE (4-7): interferes with normal activities or awakens from sleep    - SEVERE (8-10): excruciating pain, unable to do any normal activities      No pain at time of call but then will go to 8-9 when it comes baclk 7. CARDIAC RISK FACTORS: "Do you have any history of heart problems or risk factors for heart disease?" (e.g., prior heart attack, angina; high blood pressure, diabetes, being overweight, high cholesterol, smoking, or strong family history of heart disease)     no 8. PULMONARY RISK FACTORS: "Do you have any history of lung disease?"  (e.g., blood clots in lung, asthma, emphysema, birth control pills)     no 9. CAUSE: "What do you think is causing the chest pain?"     anxiety 10. OTHER SYMPTOMS: "Do you have any other symptoms?" (e.g., dizziness, nausea, vomiting, sweating, fever, difficulty breathing, cough)       Face gets hot and fluashed for 30 minutes and will go away - having twice a day 11. PREGNANCY: "Is there any chance you are pregnant?" "When was your last menstrual period?"  No  Protocols used: CHEST PAIN-A-AH

## 2018-12-05 NOTE — Telephone Encounter (Signed)
Pt states she has just spoken with someone

## 2018-12-06 ENCOUNTER — Ambulatory Visit: Payer: Self-pay

## 2018-12-06 NOTE — Telephone Encounter (Signed)
Pt called to say that she has been having bad feeling while taking Buspar.  She states that she is having tight chest tight abdomin and tightness in her back.  Her left arm is numb. She states that she feels as though she is still moving after walking even though she is still. She fells her anxiety is not controled. Per protocol pt will go to ER for evaluation of her symptoms. Pt will take her medication to the ER. She states she is to take it again at 6 pm. She was told to hold until advised by ED physician.  Reason for Disposition . [1] Dizziness (vertigo) present now AND [2] one or more stroke risk factors (i.e., hypertension, diabetes, prior stroke/TIA/heart attack)  (Exception: prior physician evaluation for this AND no different/worse than usual)  Answer Assessment - Initial Assessment Questions 1. SYMPTOMS: "Do you have any symptoms?"     Tight chest, back, stomach,  Room moving 2. SEVERITY: If symptoms are present, ask "Are they mild, moderate or severe?"     Taking medication Buspar  Answer Assessment - Initial Assessment Questions 1. DESCRIPTION: "Describe your dizziness."     Pt feels like she is still moving when standing still 2. VERTIGO: "Do you feel like either you or the room is spinning or tilting?"      No spinning 3. LIGHTHEADED: "Do you feel lightheaded?" (e.g., somewhat faint, woozy, weak upon standing)     no 4. SEVERITY: "How bad is it?"  "Can you walk?"   - MILD - Feels unsteady but walking normally.   - MODERATE - Feels very unsteady when walking, but not falling; interferes with normal activities (e.g., school, work) .   - SEVERE - Unable to walk without falling (requires assistance).     moderate 5. ONSET:  "When did the dizziness begin?"     Comes and goes and more today 6. AGGRAVATING FACTORS: "Does anything make it worse?" (e.g., standing, change in head position)     Changing positions 7. CAUSE: "What do you think is causing the dizziness?"     Buspar new  medication  8. RECURRENT SYMPTOM: "Have you had dizziness before?" If so, ask: "When was the last time?" "What happened that time?"     No 9. OTHER SYMPTOMS: "Do you have any other symptoms?" (e.g., headache, weakness, numbness, vomiting, earache)     Numbness left arm 10. PREGNANCY: "Is there any chance you are pregnant?" "When was your last menstrual period?"       No feb 3  Protocols used: DIZZINESS - VERTIGO-A-AH, MEDICATION QUESTION CALL-A-AH

## 2018-12-07 ENCOUNTER — Encounter: Payer: Self-pay | Admitting: Family Medicine

## 2018-12-07 ENCOUNTER — Ambulatory Visit (INDEPENDENT_AMBULATORY_CARE_PROVIDER_SITE_OTHER): Payer: 59 | Admitting: Family Medicine

## 2018-12-07 ENCOUNTER — Ambulatory Visit: Payer: 59 | Admitting: Family Medicine

## 2018-12-07 VITALS — BP 120/62 | HR 69 | Temp 98.1°F | Wt 172.9 lb

## 2018-12-07 DIAGNOSIS — F419 Anxiety disorder, unspecified: Secondary | ICD-10-CM

## 2018-12-07 NOTE — Telephone Encounter (Signed)
noted 

## 2018-12-07 NOTE — Telephone Encounter (Signed)
Will send to Dr. Koberlein as FYI 

## 2018-12-07 NOTE — Progress Notes (Signed)
Tina Melendez DOB: Aug 30, 1981 Encounter date: 12/07/2018  This is a 38 y.o. female who presents with Chief Complaint  Patient presents with  . Anxiety    History of present illness: Getting some flushing/hot sensation with the buspar. Still feeling like she has some vertigo. Felt like that intensified yesterday. Walking around store and really noted symptoms. Sitting here in office; feels like she is moving. Feels like she has a rocking sensation. Tried to take 3 half tablets (TID) over weekend. Noticed when she increased to 3 tablets daily it "felt like WWIII" in body. Felt really hot. Rocking sensation really bad yesterday. Was trying to get exercise/walk around and just really hit her hard. Was told by [facility] to go to ER last night; but has been so many times didn't want to go. Feels like the buspar dose wears off at night.   Had rough night last night sleep wise.   Never had issues with vertigo in past. Does have headache - usually right frontal. Did take ibuprofen yesterday; did make it dull. Not really had nausea (except just before cycle). No vomiting. Ringing in ears. Has been there since beginning but thinks worsening. Headache comes and goes. Does not usually wake with this.  Notes that she wakes at night - thinks anxiety waking her. Notes in back. Does feel like the rapid heart beat has slowed some - not as hard when she feels it.   Has done some therapy over the phone. Had second session yesterday.   Overall feels like there has been some physical improvement with regards to anxiety and symptoms. She notes that she is not getting the palpitations as significantly or often as she was previously. Her sleep has improved significantly and she is able to fall asleep now without much difficulty (previously she was very anxious and had difficulty with sleep initiation). Still wakes after about 4 hours, but overall sleep has improved.   Having less episodes of severe anxiety during  the day.    Allergies  Allergen Reactions  . Macrobid [Nitrofurantoin Macrocrystal]     Made feel terrible, out of sorts, some shortness of breath   Current Meds  Medication Sig  . busPIRone (BUSPAR) 10 MG tablet Start with 0.5 tab twice daily x 7 days, then increase to full tab twice daily  . ferrous sulfate 325 (65 FE) MG EC tablet Take 1 tablet (325 mg total) by mouth 2 (two) times daily.  . Multiple Vitamin (MULTIVITAMIN WITH MINERALS) TABS tablet Take 1 tablet by mouth daily.  . [DISCONTINUED] Probiotic Product (PROBIOTIC-10 PO) Take 1 tablet by mouth 2 (two) times daily.    Review of Systems  Constitutional: Positive for appetite change (decreased) and fatigue.  Respiratory: Positive for chest tightness and shortness of breath (sometimes with episodes).   Cardiovascular: Positive for palpitations. Negative for chest pain and leg swelling.  Gastrointestinal: Positive for abdominal pain (right sided abd pain; notes more after taking meds- buspar) and constipation (slight from iron; but still having regular bm). Negative for diarrhea, nausea and vomiting.       Makes self eat; no appetite  Endocrine: Positive for polydipsia.  Musculoskeletal:       Noted some forearm pain other day; sometimes in legs. Intermittent. Notes in long bones.    Skin: Negative for color change and rash.  Neurological: Positive for weakness (notes during episodes), light-headedness, numbness and headaches.       Numbness left hand - associated just with episodes that she  gets.     Objective:  BP 120/62 (BP Location: Left Arm, Patient Position: Sitting, Cuff Size: Normal)   Pulse 69   Temp 98.1 F (36.7 C) (Oral)   Wt 172 lb 14.4 oz (78.4 kg)   LMP 11/08/2018 (Approximate)   SpO2 99%   BMI 27.08 kg/m   Weight: 172 lb 14.4 oz (78.4 kg)   BP Readings from Last 3 Encounters:  12/07/18 120/62  12/02/18 120/70  12/01/18 109/74   Wt Readings from Last 3 Encounters:  12/07/18 172 lb 14.4 oz (78.4  kg)  12/02/18 171 lb 3.2 oz (77.7 kg)  12/01/18 171 lb 8 oz (77.8 kg)    Physical Exam Constitutional:      General: She is not in acute distress.    Appearance: She is well-developed.  HENT:     Right Ear: Tympanic membrane, ear canal and external ear normal.     Left Ear: Tympanic membrane, ear canal and external ear normal.  Cardiovascular:     Rate and Rhythm: Normal rate and regular rhythm.     Heart sounds: Normal heart sounds. No murmur. No friction rub.  Pulmonary:     Effort: Pulmonary effort is normal. No respiratory distress.     Breath sounds: Normal breath sounds. No wheezing or rales.  Musculoskeletal:     Right lower leg: No edema.     Left lower leg: No edema.  Neurological:     Mental Status: She is alert and oriented to person, place, and time.     Cranial Nerves: Cranial nerves are intact.     Motor: Motor function is intact.     Coordination: Romberg sign negative.     Gait: Gait normal.     Deep Tendon Reflexes:     Reflex Scores:      Tricep reflexes are 2+ on the right side and 2+ on the left side.      Bicep reflexes are 2+ on the right side and 2+ on the left side.      Brachioradialis reflexes are 2+ on the right side and 2+ on the left side.      Patellar reflexes are 2+ on the right side and 2+ on the left side. Psychiatric:        Behavior: Behavior normal.     Assessment/Plan 1. Anxiety - We discussed treatment in detail today. She felt terrible when she tried low dose zoloft (25mg ) and is hesitant to try other SSRIs. She had opposite rxn to melatonin which caused more anxiety and kept her wide awake. She has increased anxiety and increased alertness with anti-histamines so we have avoided medications like vistaril or benadryl. Her blood pressures have remained low, so I am hesitant to try beta blocker for anxiety, esp with her sensitivities. She has been on buspar for over a week and is willing to keep working with dosing and seeing if she gets  benefit from med. I encouraged her to also pursue therapy (she is meeting with spiritual advisor but she is not therapist) now. We briefly discussed psychiatry referral if not improving. Since there have been some areas of improvement, she is comfortable to continue to monitor/update and will message me next week.    2. Anemia This has improved. I suspect as this continues to improve she will continue to feel better. Not sure if vertigo type sensation is related to anemia or anxiety or combination of both. I would like to keep close eye on these sx  and if not improving pursue further evaluation.    Return pending mychart update.    Micheline Rough, MD

## 2018-12-07 NOTE — Telephone Encounter (Signed)
Pt scheduled for OV today. Did not seek evaluation in ED.

## 2018-12-09 NOTE — Telephone Encounter (Signed)
Patient was seen in the office.

## 2018-12-13 NOTE — Telephone Encounter (Signed)
Checked status on Cover my meds. Message states that a fax with determination will be faxed to the office.

## 2018-12-22 ENCOUNTER — Inpatient Hospital Stay: Payer: 59 | Admitting: Hematology

## 2018-12-24 ENCOUNTER — Inpatient Hospital Stay: Payer: 59 | Attending: Internal Medicine | Admitting: Hematology and Oncology

## 2018-12-24 ENCOUNTER — Telehealth: Payer: Self-pay | Admitting: Hematology and Oncology

## 2018-12-24 ENCOUNTER — Inpatient Hospital Stay: Payer: 59

## 2018-12-24 DIAGNOSIS — K5900 Constipation, unspecified: Secondary | ICD-10-CM

## 2018-12-24 DIAGNOSIS — D509 Iron deficiency anemia, unspecified: Secondary | ICD-10-CM

## 2018-12-24 DIAGNOSIS — Z79899 Other long term (current) drug therapy: Secondary | ICD-10-CM | POA: Diagnosis not present

## 2018-12-24 LAB — CBC WITH DIFFERENTIAL (CANCER CENTER ONLY)
Abs Immature Granulocytes: 0.01 10*3/uL (ref 0.00–0.07)
Basophils Absolute: 0 10*3/uL (ref 0.0–0.1)
Basophils Relative: 1 %
Eosinophils Absolute: 0.2 10*3/uL (ref 0.0–0.5)
Eosinophils Relative: 3 %
HCT: 37.8 % (ref 36.0–46.0)
Hemoglobin: 11.4 g/dL — ABNORMAL LOW (ref 12.0–15.0)
Immature Granulocytes: 0 %
LYMPHS PCT: 22 %
Lymphs Abs: 1.4 10*3/uL (ref 0.7–4.0)
MCH: 20.8 pg — AB (ref 26.0–34.0)
MCHC: 30.2 g/dL (ref 30.0–36.0)
MCV: 69 fL — ABNORMAL LOW (ref 80.0–100.0)
Monocytes Absolute: 0.3 10*3/uL (ref 0.1–1.0)
Monocytes Relative: 5 %
NEUTROS PCT: 69 %
Neutro Abs: 4.2 10*3/uL (ref 1.7–7.7)
Platelet Count: 187 10*3/uL (ref 150–400)
RBC: 5.48 MIL/uL — ABNORMAL HIGH (ref 3.87–5.11)
RDW: 21 % — ABNORMAL HIGH (ref 11.5–15.5)
WBC Count: 6.1 10*3/uL (ref 4.0–10.5)
nRBC: 0 % (ref 0.0–0.2)

## 2018-12-24 NOTE — Progress Notes (Signed)
[facility] NOTE  Patient Care Team: Caren Macadam, MD as PCP - General (Family Medicine)  CHIEF COMPLAINTS/PURPOSE OF CONSULTATION:  Microcytic anemia  HISTORY OF PRESENTING ILLNESS:  Tina Melendez 38 y.o. female is here because of recent diagnosis of microcytic hypochromic anemia.  Patient has a history of heavy menstrual cycles due to uterine fibroids.  Because of this work-up was performed which revealed that she had anemia with a hemoglobin of 9.3 and low ferritin.  She was put on oral iron therapy which she stopped about a week ago because of constipation issues and she has been taking an over-the-counter supplement called blood builder which contains a small dose of 26 mg of elemental iron.  She has been taking that twice a day.  It appears that the anemia has significantly improved from 9.3g to 11.4 g.  In spite of this the microcytosis has not changed with an MCV still in the 60s.  She does not remember any family history of anemia.  She is here today accompanied by her husband.  I reviewed her records extensively and collaborated the history with the patient.  MEDICAL HISTORY:  Past Medical History:  Diagnosis Date  . Anemia   . Fibroid uterus     SURGICAL HISTORY: Past Surgical History:  Procedure Laterality Date  . DILATION AND CURETTAGE OF UTERUS     miscarriage    SOCIAL HISTORY: Social History   Socioeconomic History  . Marital status: Married    Spouse name: Not on file  . Number of children: Not on file  . Years of education: Not on file  . Highest education level: Not on file  Occupational History  . Not on file  Social Needs  . Financial resource strain: Not on file  . Food insecurity:    Worry: Not on file    Inability: Not on file  . Transportation needs:    Medical: Not on file    Non-medical: Not on file  Tobacco Use  . Smoking status: Never Smoker  . Smokeless tobacco: Never Used  Substance and Sexual  Activity  . Alcohol use: Not Currently    Comment: occ  . Drug use: Never  . Sexual activity: Yes    Partners: Male    Birth control/protection: None  Lifestyle  . Physical activity:    Days per week: Not on file    Minutes per session: Not on file  . Stress: Not on file  Relationships  . Social connections:    Talks on phone: Not on file    Gets together: Not on file    Attends religious service: Not on file    Active member of club or organization: Not on file    Attends meetings of clubs or organizations: Not on file    Relationship status: Not on file  . Intimate partner violence:    Fear of current or ex partner: Not on file    Emotionally abused: Not on file    Physically abused: Not on file    Forced sexual activity: Not on file  Other Topics Concern  . Not on file  Social History Narrative  . Not on file    FAMILY HISTORY: Family History  Problem Relation Age of Onset  . Alcohol abuse Mother   . Arthritis Mother   . Arthritis Sister   . Alcohol abuse Brother   . Asthma Brother   . Sickle cell anemia Maternal Grandmother   .  Breast cancer Paternal Grandmother     ALLERGIES:  is allergic to macrobid [nitrofurantoin macrocrystal].  MEDICATIONS:  Current Outpatient Medications  Medication Sig Dispense Refill  . busPIRone (BUSPAR) 10 MG tablet Start with 0.5 tab twice daily x 7 days, then increase to full tab twice daily 60 tablet 2  . ferrous sulfate 325 (65 FE) MG EC tablet Take 1 tablet (325 mg total) by mouth 2 (two) times daily. 60 tablet 2  . Multiple Vitamin (MULTIVITAMIN WITH MINERALS) TABS tablet Take 1 tablet by mouth daily.     No current facility-administered medications for this visit.     REVIEW OF SYSTEMS:   Constitutional: Denies fevers, chills or abnormal night sweats Eyes: Denies blurriness of vision, double vision or watery eyes Ears, nose, mouth, throat, and face: Denies mucositis or sore throat Respiratory: Denies cough, dyspnea or  wheezes Cardiovascular: Denies palpitation, chest discomfort or lower extremity swelling Gastrointestinal:  Denies nausea, heartburn or change in bowel habits Skin: Denies abnormal skin rashes Lymphatics: Denies new lymphadenopathy or easy bruising Neurological:Denies numbness, tingling or new weaknesses Behavioral/Psych: Mood is stable, no new changes    All other systems were reviewed with the patient and are negative.  PHYSICAL EXAMINATION: ECOG PERFORMANCE STATUS: 1 - Symptomatic but completely ambulatory  Vitals:   12/24/18 0958  BP: 122/83  Pulse: 66  Resp: 18  Temp: 99.1 F (37.3 C)  SpO2: 100%   Filed Weights   12/24/18 0958  Weight: 163 lb 8 oz (74.2 kg)    GENERAL:alert, no distress and comfortable SKIN: skin color, texture, turgor are normal, no rashes or significant lesions EYES: normal, conjunctiva are pink and non-injected, sclera clear OROPHARYNX:no exudate, no erythema and lips, buccal mucosa, and tongue normal  NECK: supple, thyroid normal size, non-tender, without nodularity LYMPH:  no palpable lymphadenopathy in the cervical, axillary or inguinal LUNGS: clear to auscultation and percussion with normal breathing effort HEART: regular rate & rhythm and no murmurs and no lower extremity edema ABDOMEN:abdomen soft, non-tender and normal bowel sounds Musculoskeletal:no cyanosis of digits and no clubbing  PSYCH: alert & oriented x 3 with fluent speech NEURO: no focal motor/sensory deficits    LABORATORY DATA:  I have reviewed the data as listed Lab Results  Component Value Date   WBC 6.1 12/24/2018   HGB 11.4 (L) 12/24/2018   HCT 37.8 12/24/2018   MCV 69.0 (L) 12/24/2018   PLT 187 12/24/2018   Lab Results  Component Value Date   NA 137 12/01/2018   K 4.1 12/01/2018   CL 105 12/01/2018   CO2 25 12/01/2018    RADIOGRAPHIC STUDIES: I have personally reviewed the radiological reports and agreed with the findings in the report.  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 Discussion: I discussed with the patient that she had a remarkable improvement in the hemoglobin within 1 month of starting oral iron therapy from 9.9 to 11.4 g.  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  She may have either beta thalassemia or sickle cell trait.  Recommendation: Hemoglobin electrophoresis and repeat iron testing For the iron deficiency anemia: Patient is intolerant to oral iron prescription therapy. If the iron levels are stable then we can continue to watch and monitor. I discussed with her if the iron levels dropped significantly then we may  have to consider IV iron therapy.  She follows with her GYN for her heavy uterine bleeding.  She will also contact her primary care physician to evaluate stool for Hemoccults.  Return to clinic in 1 week after the blood work to discuss results.   All questions were answered. The patient knows to call the clinic with any problems, questions or concerns.    Harriette Ohara, MD 12/24/18

## 2018-12-24 NOTE — Telephone Encounter (Signed)
Gave avs and calendar ° °

## 2018-12-24 NOTE — Assessment & Plan Note (Addendum)
Diagnosed December 2019: Hemoglobin 9.3, MCV 61, RDW 19.2 11/02/2018: Ferritin 9.2, iron saturation 38%, hemoglobin 11.4, MCV 65.4  Current treatment: Oral iron therapy. Discussion: I discussed with the patient that she had a remarkable improvement in the hemoglobin within 1 month of starting oral iron therapy from 9.9 to 11.4 g.  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  She may have either beta thalassemia or sickle cell trait.  Recommendation: Hemoglobin electrophoresis and repeat iron testing For the iron deficiency anemia I agree with the current management with oral iron therapy. She does need work-up for the iron deficiency with the GI/GYN referral.  Return to clinic in 1 week after the blood work to discuss results.

## 2018-12-26 ENCOUNTER — Encounter: Payer: Self-pay | Admitting: Family Medicine

## 2018-12-26 LAB — IRON AND TIBC
Iron: 90 ug/dL (ref 41–142)
Saturation Ratios: 27 % (ref 21–57)
TIBC: 333 ug/dL (ref 236–444)
UIBC: 243 ug/dL (ref 120–384)

## 2018-12-26 LAB — FERRITIN: Ferritin: 18 ng/mL (ref 11–307)

## 2018-12-27 LAB — HEMOGLOBINOPATHY EVALUATION
Hgb A2 Quant: 1.3 % — ABNORMAL LOW (ref 1.8–3.2)
Hgb A: 98.7 % (ref 96.4–98.8)
Hgb C: 0 %
Hgb F Quant: 0 % (ref 0.0–2.0)
Hgb S Quant: 0 %
Hgb Variant: 0 %

## 2019-01-05 ENCOUNTER — Emergency Department: Admit: 2019-01-05 | Payer: Self-pay | Primary: Family

## 2019-01-05 ENCOUNTER — Inpatient Hospital Stay
Admit: 2019-01-05 | Discharge: 2019-01-05 | Disposition: A | Discharge status: Home / Self Care | Payer: Self-pay | Attending: Emergency Medicine

## 2019-01-05 DIAGNOSIS — R51 Headache: Secondary | ICD-10-CM

## 2019-01-05 LAB — EKG 12-LEAD
Atrial Rate: 66 {beats}/min
Diagnosis: NORMAL
P Axis: 64 degrees
P-R Interval: 164 ms
Q-T Interval: 398 ms
QRS Duration: 84 ms
QTc Calculation (Bazett): 417 ms
R Axis: 6 degrees
T Axis: 54 degrees
Ventricular Rate: 66 {beats}/min

## 2019-01-05 LAB — CBC WITH AUTO DIFFERENTIAL
Basophils %: 1 % (ref 0.0–2.0)
Basophils Absolute: 0.1 10*3/uL (ref 0.0–0.2)
Eosinophils %: 3 % (ref 0.5–7.8)
Eosinophils Absolute: 0.2 10*3/uL (ref 0.0–0.8)
Granulocyte Absolute Count: 0 10*3/uL (ref 0.0–0.5)
Hematocrit: 36 % (ref 35.8–46.3)
Hemoglobin: 11.1 g/dL — ABNORMAL LOW (ref 11.7–15.4)
Immature Granulocytes: 0 % (ref 0.0–5.0)
Lymphocytes %: 20 % (ref 13–44)
Lymphocytes Absolute: 1.8 10*3/uL (ref 0.5–4.6)
MCH: 21.3 PG — ABNORMAL LOW (ref 26.1–32.9)
MCHC: 30.8 g/dL — ABNORMAL LOW (ref 31.4–35.0)
MCV: 69.2 FL — ABNORMAL LOW (ref 79.6–97.8)
MPV: UNDETERMINED FL (ref 9.4–12.3)
Monocytes %: 6 % (ref 4.0–12.0)
Monocytes Absolute: 0.5 10*3/uL (ref 0.1–1.3)
NRBC Absolute: 0 10*3/uL (ref 0.0–0.2)
Neutrophils %: 70 % (ref 43–78)
Neutrophils Absolute: 6.1 10*3/uL (ref 1.7–8.2)
Platelets: 189 10*3/uL (ref 150–450)
RBC: 5.2 M/uL (ref 4.05–5.2)
RDW: 19.5 % — ABNORMAL HIGH (ref 11.9–14.6)
WBC: 8.7 10*3/uL (ref 4.3–11.1)

## 2019-01-05 LAB — TROPONIN: Troponin I: <0.02 NG/ML — ABNORMAL LOW (ref 0.02–0.05)

## 2019-01-05 LAB — COMPREHENSIVE METABOLIC PANEL
ALT: 17 U/L (ref 12–65)
AST: 10 U/L — ABNORMAL LOW (ref 15–37)
Albumin/Globulin Ratio: 0.9 — ABNORMAL LOW (ref 1.2–3.5)
Albumin: 3.5 g/dL (ref 3.5–5.0)
Alkaline Phosphatase: 63 U/L (ref 50–130)
Anion Gap: 4 mmol/L — ABNORMAL LOW (ref 7–16)
BUN: 15 MG/DL (ref 6–23)
CO2: 28 mmol/L (ref 21–32)
Calcium: 8.9 MG/DL (ref 8.3–10.4)
Chloride: 105 mmol/L (ref 98–107)
Creatinine: 0.68 MG/DL (ref 0.6–1.0)
EGFR IF NonAfrican American: >60 mL/min/{1.73_m2} (ref >60)
GFR African American: >60 mL/min/{1.73_m2} (ref >60)
Globulin: 3.9 g/dL — ABNORMAL HIGH (ref 2.3–3.5)
Glucose: 87 mg/dL (ref 65–100)
Potassium: 3.7 mmol/L (ref 3.5–5.1)
Sodium: 137 mmol/L (ref 136–145)
Total Bilirubin: 0.2 MG/DL (ref 0.2–1.1)
Total Protein: 7.4 g/dL (ref 6.3–8.2)

## 2019-01-05 LAB — METABOLIC PANEL, COMPREHENSIVE
A-G Ratio: 0.9 — ABNORMAL LOW (ref 1.2–3.5)
ALT (SGPT): 17 U/L (ref 12–65)
AST (SGOT): 10 U/L — ABNORMAL LOW (ref 15–37)
Albumin: 3.5 g/dL (ref 3.5–5.0)
Alk. phosphatase: 63 U/L (ref 50–130)
Anion gap: 4 mmol/L — ABNORMAL LOW (ref 7–16)
BUN: 15 MG/DL (ref 6–23)
Bilirubin, total: 0.2 MG/DL (ref 0.2–1.1)
CO2: 28 mmol/L (ref 21–32)
Calcium: 8.9 MG/DL (ref 8.3–10.4)
Chloride: 105 mmol/L (ref 98–107)
Creatinine: 0.68 MG/DL (ref 0.6–1.0)
GFR est AA: >60 mL/min/{1.73_m2} (ref >60)
GFR est non-AA: >60 mL/min/{1.73_m2} (ref >60)
Globulin: 3.9 g/dL — ABNORMAL HIGH (ref 2.3–3.5)
Glucose: 87 mg/dL (ref 65–100)
Potassium: 3.7 mmol/L (ref 3.5–5.1)
Protein, total: 7.4 g/dL (ref 6.3–8.2)
Sodium: 137 mmol/L (ref 136–145)

## 2019-01-05 LAB — CBC WITH AUTOMATED DIFF
ABS. BASOPHILS: 0.1 10*3/uL (ref 0.0–0.2)
ABS. EOSINOPHILS: 0.2 10*3/uL (ref 0.0–0.8)
ABS. IMM. GRANS.: 0 10*3/uL (ref 0.0–0.5)
ABS. LYMPHOCYTES: 1.8 10*3/uL (ref 0.5–4.6)
ABS. MONOCYTES: 0.5 10*3/uL (ref 0.1–1.3)
ABS. NEUTROPHILS: 6.1 10*3/uL (ref 1.7–8.2)
ABSOLUTE NRBC: 0 10*3/uL (ref 0.0–0.2)
BASOPHILS: 1 % (ref 0.0–2.0)
EOSINOPHILS: 3 % (ref 0.5–7.8)
HCT: 36 % (ref 35.8–46.3)
HGB: 11.1 g/dL — ABNORMAL LOW (ref 11.7–15.4)
IMMATURE GRANULOCYTES: 0 % (ref 0.0–5.0)
LYMPHOCYTES: 20 % (ref 13–44)
MCH: 21.3 PG — ABNORMAL LOW (ref 26.1–32.9)
MCHC: 30.8 g/dL — ABNORMAL LOW (ref 31.4–35.0)
MCV: 69.2 FL — ABNORMAL LOW (ref 79.6–97.8)
MONOCYTES: 6 % (ref 4.0–12.0)
MPV: UNDETERMINED FL (ref 9.4–12.3)
NEUTROPHILS: 70 % (ref 43–78)
PLATELET: 189 10*3/uL (ref 150–450)
RBC: 5.2 M/uL (ref 4.05–5.2)
RDW: 19.5 % — ABNORMAL HIGH (ref 11.9–14.6)
WBC: 8.7 10*3/uL (ref 4.3–11.1)

## 2019-01-05 LAB — EKG, 12 LEAD, INITIAL
Atrial Rate: 66 {beats}/min
Calculated P Axis: 64 degrees
Calculated R Axis: 6 degrees
Calculated T Axis: 54 degrees
Diagnosis: NORMAL
P-R Interval: 164 ms
Q-T Interval: 398 ms
QRS Duration: 84 ms
QTC Calculation (Bezet): 417 ms
Ventricular Rate: 66 {beats}/min

## 2019-01-05 LAB — TROPONIN I: Troponin-I, Qt.: <0.02 NG/ML — ABNORMAL LOW (ref 0.02–0.05)

## 2019-01-05 MED ORDER — SODIUM CHLORIDE 0.9% BOLUS IV
0.9 % | Freq: Once | INTRAVENOUS | Status: AC
Start: 2019-01-05 — End: 2019-01-05
  Administered 2019-01-05: 20:00:00 via INTRAVENOUS

## 2019-01-05 MED ORDER — KETOROLAC TROMETHAMINE 30 MG/ML INJECTION
30 mg/mL (1 mL) | INTRAMUSCULAR | Status: AC
Start: 2019-01-05 — End: 2019-01-05
  Administered 2019-01-05: 20:00:00 via INTRAVENOUS

## 2019-01-05 MED ORDER — BUTALBITAL-ACETAMINOPHEN-CAFFEINE 50 MG-300 MG-40 MG CAPSULE
50-300-40 mg | ORAL_CAPSULE | ORAL | 0 refills | Status: DC | PRN
Start: 2019-01-05 — End: 2019-05-08

## 2019-01-05 MED ORDER — SODIUM CHLORIDE 0.9 % INJECTION
5 mg/mL | INTRAMUSCULAR | Status: DC
Start: 2019-01-05 — End: 2019-01-05

## 2019-01-05 MED FILL — KETOROLAC TROMETHAMINE 30 MG/ML INJECTION: 30 mg/mL (1 mL) | INTRAMUSCULAR | Qty: 1

## 2019-01-05 MED FILL — PROCHLORPERAZINE EDISYLATE 5 MG/ML INJECTION: 5 mg/mL | INTRAMUSCULAR | Qty: 2

## 2019-01-05 NOTE — ED Provider Notes (Signed)
Patient states for 4 to 5 weeks has had across the chest pain going down the left arm sharp in nature tender to palpation.  Mild shortness of breath.  Mild nausea.  No numbness or diaphoresis.  During the same timeframe has had a mild bilateral frontal headache with intermittent blurry vision.  No runny nose sore throat or ear pain.  No abdominal pain.    The history is provided by the patient. No language interpreter was used.   Blurred Vision    This is a new problem. Episode onset: 5 weeks. The problem occurs daily. The problem has not changed since onset.Both eyes are affected.The injury mechanism was none. The pain is mild. Associated symptoms include blurred vision and nausea. Pertinent negatives include no numbness, no decreased vision, no discharge, no double vision, no foreign body sensation, no photophobia, no eye redness, no vomiting, no tingling, no weakness, no itching, no fever, no pain, no blindness, no head injury and no dizziness. She has tried nothing for the symptoms.        Past Medical History:   Diagnosis Date   ??? Autosomal dominant congenital non-nuclear cataract    ??? 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 Vitiligo        Past Surgical History:   Procedure Laterality Date   ??? HX GYN      D&C   ??? HX WISDOM TEETH EXTRACTION      x1 left bottom         Family History:   Problem Relation Age of Onset   ??? Celiac Disease Mother    ??? Diabetes Mother    ??? Hypertension Mother    ??? Hypertension Father    ??? No Known Problems Sister    ??? Asthma Brother    ??? Hypertension Maternal Grandmother    ??? Diabetes Maternal Grandmother    ??? No Known Problems Maternal Grandfather    ??? Breast Cancer Paternal Grandmother    ??? Diabetes Paternal Grandmother    ??? Hypertension Paternal Grandmother    ??? Heart Attack Paternal Grandfather        Social History     Socioeconomic History   ??? Marital status: MARRIED     Spouse name: Not on file   ??? Number of children: Not on file    ??? Years of education: Not on file   ??? Highest education level: Not on file   Occupational History   ??? Not on file   Social Needs   ??? Financial resource strain: Not on file   ??? Food insecurity     Worry: Not on file     Inability: Not on file   ??? Transportation needs     Medical: Not on file     Non-medical: Not on file   Tobacco Use   ??? Smoking status: Former Smoker     Last attempt to quit: 05/27/2015     Years since quitting: 3.6   ??? Smokeless tobacco: Never Used   Substance and Sexual Activity   ??? Alcohol use: Yes     Alcohol/week: 7.0 standard drinks     Types: 3 Glasses of wine, 4 Shots of liquor per week   ??? Drug use: No   ??? Sexual activity: Yes     Partners: Male     Birth control/protection: None   Lifestyle   ??? Physical activity     Days per week: Not on file  Minutes per session: Not on file   ??? Stress: Not on file   Relationships   ??? Social Product manager on phone: Not on file     Gets together: Not on file     Attends religious service: Not on file     Active member of club or organization: Not on file     Attends meetings of clubs or organizations: Not on file     Relationship status: Not on file   ??? Intimate partner violence     Fear of current or ex partner: Not on file     Emotionally abused: Not on file     Physically abused: Not on file     Forced sexual activity: Not on file   Other Topics Concern   ??? Not on file   Social History Narrative   ??? Not on file         ALLERGIES: Macrobid [nitrofurantoin monohyd/m-cryst]    Review of Systems   Constitutional: Negative for chills and fever.   HENT: Negative for rhinorrhea and sore throat.    Eyes: Positive for blurred vision and visual disturbance. Negative for blindness, double vision, photophobia, pain, discharge and redness.   Respiratory: Positive for shortness of breath. Negative for chest tightness and wheezing.    Cardiovascular: Positive for chest pain. Negative for leg swelling.    Gastrointestinal: Positive for nausea. Negative for abdominal pain, diarrhea and vomiting.   Genitourinary: Negative for dysuria and hematuria.   Musculoskeletal: Negative for back pain, gait problem, neck pain and neck stiffness.   Skin: Negative for color change, itching and rash.   Neurological: Negative for dizziness, tingling, facial asymmetry, speech difficulty, weakness, light-headedness, numbness and headaches.   Psychiatric/Behavioral: Negative for confusion.       Vitals:    01/05/19 1318   BP: 129/86   Pulse: 68   Resp: 16   Temp: 98 ??F (36.7 ??C)   SpO2: 98%   Weight: 72.6 kg (160 lb)   Height: _0  (1.702 m)            Physical Exam  Constitutional:       Appearance: Normal appearance. She is well-developed.   HENT:      Head: Normocephalic and atraumatic.   Eyes:      Extraocular Movements: Extraocular movements intact.      Pupils: Pupils are equal, round, and reactive to light.   Neck:      Musculoskeletal: Normal range of motion and neck supple.   Cardiovascular:      Rate and Rhythm: Normal rate and regular rhythm.   Pulmonary:      Effort: Pulmonary effort is normal.      Breath sounds: Normal breath sounds.   Abdominal:      General: Bowel sounds are normal.      Palpations: Abdomen is soft.      Tenderness: There is no abdominal tenderness.   Musculoskeletal: Normal range of motion.         General: No swelling.   Skin:     General: Skin is warm and dry.   Neurological:      General: No focal deficit present.      Mental Status: She is alert and oriented to person, place, and time.      Cranial Nerves: No cranial nerve deficit.      Motor: No weakness.          MDM  Number of  Diagnoses or Management Options  Diagnosis management comments: Patient with chest pain tender to palpation and bilateral frontal headache.  No acute on CT EKG chest x-ray or blood work.  Feels better here.  Will discharge.       Amount and/or Complexity of Data Reviewed  Clinical lab tests: ordered and reviewed   Tests in the radiology section of CPT??: ordered and reviewed  Tests in the medicine section of CPT??: ordered and reviewed    Patient Progress  Patient progress: stable         Procedures        EKG: normal sinus rhythm, nonspecific ST and T waves changes. Rate 66.                 CT HEAD WO CONT (Final result)   Result time 01/05/19 14:00:19   Final result by Trenton Gammon, DO (01/05/19 14:00:19)                Impression:    Impression: Unremarkable unenhanced CT scan of the brain.                  Narrative:    CT head without contrast    History: left arm numbness, speech difficulty since december. Diplopia    Technique: 80m axial images were obtained from the skull base to the vertex  without intravenous contrast. ??Radiation dose reduction techniques were used for  this study: ??Our CT scanners use one or all of the following: Automated exposure  control, adjustment of the mA and/or kVp according to patient's size, iterative  reconstruction.    Comparison: 01/18/2018    Findings: The ventricles and sulci are normal in size and configuration. There  are no extra-axial fluid collections. There is no evidence to suggest an acute  major territorial infarct. There is no evidence of acute intraparenchymal  hemorrhage or mass effect. The bony calvarium is intact. The visualized mastoid  air cells and paranasal sinuses are well pneumatized and aerated.            ??       XR CHEST PA LAT (Final result)   Result time 01/05/19 16:21:39   Final result by PTrenton Gammon DO (01/05/19 16:21:39)                Impression:    Impression: No active disease in the chest.                Narrative:    Two view chest    History: Chest tightness, blurred vision x1 month.     Comparison: None    Findings: The heart and mediastinal silhouette are normal in size and  configuration. The lungs and pleural spaces are clear. The pulmonary vascularity  is within normal limits. The visualized osseous structures are unremarkable.             ??     Results Include:    Recent Results (from the past 24 hour(s))   EKG, 12 LEAD, INITIAL    Collection Time: 01/05/19  1:11 PM   Result Value Ref Range    Ventricular Rate 66 BPM    Atrial Rate 66 BPM    P-R Interval 164 ms    QRS Duration 84 ms    Q-T Interval 398 ms    QTC Calculation (Bezet) 417 ms    Calculated P Axis 64 degrees    Calculated R Axis 6 degrees    Calculated  T Axis 54 degrees    Diagnosis       Normal sinus rhythm  Possible Left atrial enlargement  Low voltage QRS  Poor R wave progression  Abnormal ECG  No previous ECGs available  Confirmed by [facility]  MD ([facility]), CHRISTOPHER H [phone]) on 01/05/2019 3:44:44 PM     CBC WITH AUTOMATED DIFF    Collection Time: 01/05/19  2:05 PM   Result Value Ref Range    WBC 8.7 4.3 - 11.1 K/uL    RBC 5.20 4.05 - 5.2 M/uL    HGB 11.1 (L) 11.7 - 15.4 g/dL    HCT 36.0 35.8 - 46.3 %    MCV 69.2 (L) 79.6 - 97.8 FL    MCH 21.3 (L) 26.1 - 32.9 PG    MCHC 30.8 (L) 31.4 - 35.0 g/dL    RDW 19.5 (H) 11.9 - 14.6 %    PLATELET 189 150 - 450 K/uL    MPV Unable to calculate. Recommend adding IPF. 9.4 - 12.3 FL    ABSOLUTE NRBC 0.00 0.0 - 0.2 K/uL    DF AUTOMATED      NEUTROPHILS 70 43 - 78 %    LYMPHOCYTES 20 13 - 44 %    MONOCYTES 6 4.0 - 12.0 %    EOSINOPHILS 3 0.5 - 7.8 %    BASOPHILS 1 0.0 - 2.0 %    IMMATURE GRANULOCYTES 0 0.0 - 5.0 %    ABS. NEUTROPHILS 6.1 1.7 - 8.2 K/UL    ABS. LYMPHOCYTES 1.8 0.5 - 4.6 K/UL    ABS. MONOCYTES 0.5 0.1 - 1.3 K/UL    ABS. EOSINOPHILS 0.2 0.0 - 0.8 K/UL    ABS. BASOPHILS 0.1 0.0 - 0.2 K/UL    ABS. IMM. GRANS. 0.0 0.0 - 0.5 K/UL   METABOLIC PANEL, COMPREHENSIVE    Collection Time: 01/05/19  2:05 PM   Result Value Ref Range    Sodium 137 136 - 145 mmol/L    Potassium 3.7 3.5 - 5.1 mmol/L    Chloride 105 98 - 107 mmol/L    CO2 28 21 - 32 mmol/L    Anion gap 4 (L) 7 - 16 mmol/L    Glucose 87 65 - 100 mg/dL    BUN 15 6 - 23 MG/DL    Creatinine 0.68 0.6 - 1.0 MG/DL    GFR est AA >60 >60 ml/min/1.15m    GFR est non-AA >60 >60 ml/min/1.73m    Calcium 8.9 8.3 - 10.4 MG/DL    Bilirubin, total 0.2 0.2 - 1.1 MG/DL    ALT (SGPT) 17 12 - 65 U/L    AST (SGOT) 10 (L) 15 - 37 U/L    Alk. phosphatase 63 50 - 130 U/L    Protein, total 7.4 6.3 - 8.2 g/dL    Albumin 3.5 3.5 - 5.0 g/dL    Globulin 3.9 (H) 2.3 - 3.5 g/dL    A-G Ratio 0.9 (L) 1.2 - 3.5     TROPONIN I    Collection Time: 01/05/19  2:05 PM   Result Value Ref Range    Troponin-I, Qt. <0.02 (L) 0.02 - 0.05 NG/ML

## 2019-01-05 NOTE — ED Notes (Signed)
Patient has several co.. patient co bilateral hand numbness, chest tightness and blurred vision going on for about one month.  Patient states she was seen in ED for possible PE back in feb and got a CT scan and all her symptoms began after scan.  Patient NIH 0

## 2019-01-05 NOTE — ED Notes (Signed)
I have reviewed discharge instructions with the patient.  The patient verbalized understanding.    Patient left ED via Discharge Method: ambulatory to Home with family.    Opportunity for questions and clarification provided.       Patient given 1 scripts.         To continue your aftercare when you leave the hospital, you may receive an automated call from our care team to check in on how you are doing.  This is a free service and part of our promise to provide the best care and service to meet your aftercare needs.??? If you have questions, or wish to unsubscribe from this service please call [phone].  Thank you for Choosing our [facility].

## 2019-01-05 NOTE — ED Provider Notes (Signed)
Patient states for 4 to 5 weeks has had across the chest pain going down the left arm sharp in nature tender to palpation.  Mild shortness of breath.  Mild nausea.  No numbness or diaphoresis.  During the same timeframe has had a mild bilateral frontal headache with intermittent blurry vision.  No runny nose sore throat or ear pain.  No abdominal pain.    The history is provided by the patient. No language interpreter was used.   Blurred Vision    This is a new problem. Episode onset: 5 weeks. The problem occurs daily. The problem has not changed since onset.Both eyes are affected.The injury mechanism was none. The pain is mild. Associated symptoms include blurred vision and nausea. Pertinent negatives include no numbness, no decreased vision, no discharge, no double vision, no foreign body sensation, no photophobia, no eye redness, no vomiting, no tingling, no weakness, no itching, no fever, no pain, no blindness, no head injury and no dizziness. She has tried nothing for the symptoms.        Past Medical History:   Diagnosis Date   ??? Autosomal dominant congenital non-nuclear cataract    ??? 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 Vitiligo        Past Surgical History:   Procedure Laterality Date   ??? HX GYN      D&C   ??? HX WISDOM TEETH EXTRACTION      x1 left bottom         Family History:   Problem Relation Age of Onset   ??? Celiac Disease Mother    ??? Diabetes Mother    ??? Hypertension Mother    ??? Hypertension Father    ??? No Known Problems Sister    ??? Asthma Brother    ??? Hypertension Maternal Grandmother    ??? Diabetes Maternal Grandmother    ??? No Known Problems Maternal Grandfather    ??? Breast Cancer Paternal Grandmother    ??? Diabetes Paternal Grandmother    ??? Hypertension Paternal Grandmother    ??? Heart Attack Paternal Grandfather        Social History     Socioeconomic History   ??? Marital status: MARRIED     Spouse name: Not on file   ??? Number of children: Not on file   ??? Years of education: Not on file    ??? Highest education level: Not on file   Occupational History   ??? Not on file   Social Needs   ??? Financial resource strain: Not on file   ??? Food insecurity     Worry: Not on file     Inability: Not on file   ??? Transportation needs     Medical: Not on file     Non-medical: Not on file   Tobacco Use   ??? Smoking status: Former Smoker     Last attempt to quit: 05/27/2015     Years since quitting: 3.6   ??? Smokeless tobacco: Never Used   Substance and Sexual Activity   ??? Alcohol use: Yes     Alcohol/week: 7.0 standard drinks     Types: 3 Glasses of wine, 4 Shots of liquor per week   ??? Drug use: No   ??? Sexual activity: Yes     Partners: Male     Birth control/protection: None   Lifestyle   ??? Physical activity     Days per week: Not on file  Minutes per session: Not on file   ??? Stress: Not on file   Relationships   ??? Social Product manager on phone: Not on file     Gets together: Not on file     Attends religious service: Not on file     Active member of club or organization: Not on file     Attends meetings of clubs or organizations: Not on file     Relationship status: Not on file   ??? Intimate partner violence     Fear of current or ex partner: Not on file     Emotionally abused: Not on file     Physically abused: Not on file     Forced sexual activity: Not on file   Other Topics Concern   ??? Not on file   Social History Narrative   ??? Not on file         ALLERGIES: Macrobid [nitrofurantoin monohyd/m-cryst]    Review of Systems   Constitutional: Negative for chills and fever.   HENT: Negative for rhinorrhea and sore throat.    Eyes: Positive for blurred vision and visual disturbance. Negative for blindness, double vision, photophobia, pain, discharge and redness.   Respiratory: Positive for shortness of breath. Negative for chest tightness and wheezing.    Cardiovascular: Positive for chest pain. Negative for leg swelling.   Gastrointestinal: Positive for nausea. Negative for abdominal pain, diarrhea and vomiting.    Genitourinary: Negative for dysuria and hematuria.   Musculoskeletal: Negative for back pain, gait problem, neck pain and neck stiffness.   Skin: Negative for color change, itching and rash.   Neurological: Negative for dizziness, tingling, facial asymmetry, speech difficulty, weakness, light-headedness, numbness and headaches.   Psychiatric/Behavioral: Negative for confusion.       Vitals:    01/05/19 1318   BP: 129/86   Pulse: 68   Resp: 16   Temp: 98 ??F (36.7 ??C)   SpO2: 98%   Weight: 72.6 kg (160 lb)   Height: '5\' 7"'  (1.702 m)            Physical Exam  Constitutional:       Appearance: Normal appearance. She is well-developed.   HENT:      Head: Normocephalic and atraumatic.   Eyes:      Extraocular Movements: Extraocular movements intact.      Pupils: Pupils are equal, round, and reactive to light.   Neck:      Musculoskeletal: Normal range of motion and neck supple.   Cardiovascular:      Rate and Rhythm: Normal rate and regular rhythm.   Pulmonary:      Effort: Pulmonary effort is normal.      Breath sounds: Normal breath sounds.   Abdominal:      General: Bowel sounds are normal.      Palpations: Abdomen is soft.      Tenderness: There is no abdominal tenderness.   Musculoskeletal: Normal range of motion.         General: No swelling.   Skin:     General: Skin is warm and dry.   Neurological:      General: No focal deficit present.      Mental Status: She is alert and oriented to person, place, and time.      Cranial Nerves: No cranial nerve deficit.      Motor: No weakness.          MDM  Number of  Diagnoses or Management Options  Diagnosis management comments: Patient with chest pain tender to palpation and bilateral frontal headache.  No acute on CT EKG chest x-ray or blood work.  Feels better here.  Will discharge.       Amount and/or Complexity of Data Reviewed  Clinical lab tests: ordered and reviewed  Tests in the radiology section of CPT??: ordered and reviewed  Tests in the medicine section of CPT??:  ordered and reviewed    Patient Progress  Patient progress: stable         Procedures        EKG: normal sinus rhythm, nonspecific ST and T waves changes. Rate 66.                 CT HEAD WO CONT (Final result)   Result time 01/05/19 14:00:19   Final result by Trenton Gammon, DO (01/05/19 14:00:19)                Impression:    Impression: Unremarkable unenhanced CT scan of the brain.                  Narrative:    CT head without contrast    History: left arm numbness, speech difficulty since december. Diplopia    Technique: 84m axial images were obtained from the skull base to the vertex  without intravenous contrast. ??Radiation dose reduction techniques were used for  this study: ??Our CT scanners use one or all of the following: Automated exposure  control, adjustment of the mA and/or kVp according to patient's size, iterative  reconstruction.    Comparison: 01/18/2018    Findings: The ventricles and sulci are normal in size and configuration. There  are no extra-axial fluid collections. There is no evidence to suggest an acute  major territorial infarct. There is no evidence of acute intraparenchymal  hemorrhage or mass effect. The bony calvarium is intact. The visualized mastoid  air cells and paranasal sinuses are well pneumatized and aerated.            ??       XR CHEST PA LAT (Final result)   Result time 01/05/19 16:21:39   Final result by PTrenton Gammon DO (01/05/19 16:21:39)                Impression:    Impression: No active disease in the chest.                Narrative:    Two view chest    History: Chest tightness, blurred vision x1 month.     Comparison: None    Findings: The heart and mediastinal silhouette are normal in size and  configuration. The lungs and pleural spaces are clear. The pulmonary vascularity  is within normal limits. The visualized osseous structures are unremarkable.            ??     Results Include:    Recent Results (from the past 24 hour(s))   EKG, 12 LEAD, INITIAL     Collection Time: 01/05/19  1:11 PM   Result Value Ref Range    Ventricular Rate 66 BPM    Atrial Rate 66 BPM    P-R Interval 164 ms    QRS Duration 84 ms    Q-T Interval 398 ms    QTC Calculation (Bezet) 417 ms    Calculated P Axis 64 degrees    Calculated R Axis 6 degrees    Calculated  T Axis 54 degrees    Diagnosis       Normal sinus rhythm  Possible Left atrial enlargement  Low voltage QRS  Poor R wave progression  Abnormal ECG  No previous ECGs available  Confirmed by [facility]  MD ([facility]), CHRISTOPHER H [phone]) on 01/05/2019 3:44:44 PM     CBC WITH AUTOMATED DIFF    Collection Time: 01/05/19  2:05 PM   Result Value Ref Range    WBC 8.7 4.3 - 11.1 K/uL    RBC 5.20 4.05 - 5.2 M/uL    HGB 11.1 (L) 11.7 - 15.4 g/dL    HCT 36.0 35.8 - 46.3 %    MCV 69.2 (L) 79.6 - 97.8 FL    MCH 21.3 (L) 26.1 - 32.9 PG    MCHC 30.8 (L) 31.4 - 35.0 g/dL    RDW 19.5 (H) 11.9 - 14.6 %    PLATELET 189 150 - 450 K/uL    MPV Unable to calculate. Recommend adding IPF. 9.4 - 12.3 FL    ABSOLUTE NRBC 0.00 0.0 - 0.2 K/uL    DF AUTOMATED      NEUTROPHILS 70 43 - 78 %    LYMPHOCYTES 20 13 - 44 %    MONOCYTES 6 4.0 - 12.0 %    EOSINOPHILS 3 0.5 - 7.8 %    BASOPHILS 1 0.0 - 2.0 %    IMMATURE GRANULOCYTES 0 0.0 - 5.0 %    ABS. NEUTROPHILS 6.1 1.7 - 8.2 K/UL    ABS. LYMPHOCYTES 1.8 0.5 - 4.6 K/UL    ABS. MONOCYTES 0.5 0.1 - 1.3 K/UL    ABS. EOSINOPHILS 0.2 0.0 - 0.8 K/UL    ABS. BASOPHILS 0.1 0.0 - 0.2 K/UL    ABS. IMM. GRANS. 0.0 0.0 - 0.5 K/UL   METABOLIC PANEL, COMPREHENSIVE    Collection Time: 01/05/19  2:05 PM   Result Value Ref Range    Sodium 137 136 - 145 mmol/L    Potassium 3.7 3.5 - 5.1 mmol/L    Chloride 105 98 - 107 mmol/L    CO2 28 21 - 32 mmol/L    Anion gap 4 (L) 7 - 16 mmol/L    Glucose 87 65 - 100 mg/dL    BUN 15 6 - 23 MG/DL    Creatinine 0.68 0.6 - 1.0 MG/DL    GFR est AA >60 >60 ml/min/1.28m    GFR est non-AA >60 >60 ml/min/1.744m   Calcium 8.9 8.3 - 10.4 MG/DL    Bilirubin, total 0.2 0.2 - 1.1 MG/DL    ALT (SGPT) 17 12 - 65 U/L     AST (SGOT) 10 (L) 15 - 37 U/L    Alk. phosphatase 63 50 - 130 U/L    Protein, total 7.4 6.3 - 8.2 g/dL    Albumin 3.5 3.5 - 5.0 g/dL    Globulin 3.9 (H) 2.3 - 3.5 g/dL    A-G Ratio 0.9 (L) 1.2 - 3.5     TROPONIN I    Collection Time: 01/05/19  2:05 PM   Result Value Ref Range    Troponin-I, Qt. <0.02 (L) 0.02 - 0.05 NG/ML

## 2019-01-05 NOTE — ED Notes (Signed)
 I have reviewed discharge instructions with the patient.  The patient verbalized understanding.    Patient left ED via Discharge Method: ambulatory to Home with family.    Opportunity for questions and clarification provided.       Patient given 1 scripts.         To continue your aftercare when you leave the hospital, you may receive an automated call from our care team to check in on how you are doing.  This is a free service and part of our promise to provide the best care and service to meet your aftercare needs." If you have questions, or wish to unsubscribe from this service please call [phone].  Thank you for Choosing our [facility].

## 2019-01-06 ENCOUNTER — Ambulatory Visit: Payer: 59 | Admitting: Family Medicine

## 2019-01-13 ENCOUNTER — Ambulatory Visit: Payer: 59 | Admitting: Family Medicine

## 2019-01-13 ENCOUNTER — Encounter: Payer: Self-pay | Admitting: Family Medicine

## 2019-01-16 ENCOUNTER — Encounter: Payer: Self-pay | Admitting: Hematology and Oncology

## 2019-01-16 NOTE — Telephone Encounter (Signed)
I informed the patient that the iron studies were normal.  Hemoglobin is 11.4. 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 This is the cause of low MCV. We will follow her without any interventions needing at this time.

## 2019-01-19 ENCOUNTER — Encounter: Attending: Family | Primary: Family

## 2019-01-31 ENCOUNTER — Encounter: Payer: Self-pay | Admitting: Family Medicine

## 2019-01-31 ENCOUNTER — Other Ambulatory Visit: Payer: Self-pay

## 2019-01-31 ENCOUNTER — Ambulatory Visit (INDEPENDENT_AMBULATORY_CARE_PROVIDER_SITE_OTHER): Payer: Self-pay | Admitting: Family Medicine

## 2019-01-31 DIAGNOSIS — E079 Disorder of thyroid, unspecified: Secondary | ICD-10-CM

## 2019-01-31 DIAGNOSIS — D649 Anemia, unspecified: Secondary | ICD-10-CM

## 2019-01-31 DIAGNOSIS — R5383 Other fatigue: Secondary | ICD-10-CM

## 2019-01-31 DIAGNOSIS — F419 Anxiety disorder, unspecified: Secondary | ICD-10-CM

## 2019-01-31 MED ORDER — LORAZEPAM 0.5 MG PO TABS: 0.5000 mg | ORAL_TABLET | Freq: Three times a day (TID) | ORAL | 0 refills | Status: DC | PRN

## 2019-01-31 NOTE — Progress Notes (Signed)
Subjective:    Patient ID: Tina Melendez, female    DOB: July 10, 1981, 38 y.o.   MRN: [id number]  HPI Virtual Visit via Video Note  I connected with the patient on 01/31/19 at  1:15 PM EDT by a video enabled telemedicine application and verified that I am speaking with the correct person using two identifiers.  Location patient: home Location provider:work or home office Persons participating in the virtual visit: patient, provider  I discussed the limitations of evaluation and management by telemedicine and the availability of in person appointments. The patient expressed understanding and agreed to proceed.   HPI: She wants to discuss her anxiety, which has been a big issue for her since December. She has a constant sense of feeling on edge and of worrying about things, and she also has what seem to be panic attacks at times. She denies much in the way of depression symptoms, no sadness or hopelessness. She has trouble sleeping and her appetite is poor. She has lost some weight. She meets with a therapist virtually once a week. She has tried Buspar and now low dose Zoloft per Dr. Ethlyn Gallery, her PCP, but she says both of these make her feel even worse. She has been out of work since December. She is currently living with friends.    ROS: See pertinent positives and negatives per HPI.  Past Medical History:  Diagnosis Date  . Anemia   . Fibroid uterus     Past Surgical History:  Procedure Laterality Date  . DILATION AND CURETTAGE OF UTERUS     miscarriage    Family History  Problem Relation Age of Onset  . Alcohol abuse Mother   . Arthritis Mother   . Arthritis Sister   . Alcohol abuse Brother   . Asthma Brother   . Sickle cell anemia Maternal Grandmother   . Breast cancer Paternal Grandmother      Current Outpatient Medications:  .  ferrous sulfate 325 (65 FE) MG EC tablet, Take 1 tablet (325 mg total) by mouth 2 (two) times daily., Disp: 60 tablet, Rfl: 2 .   Multiple Vitamin (MULTIVITAMIN WITH MINERALS) TABS tablet, Take 1 tablet by mouth daily., Disp: , Rfl:  .  LORazepam (ATIVAN) 0.5 MG tablet, Take 1 tablet (0.5 mg total) by mouth every 8 (eight) hours as needed for anxiety or sleep., Disp: 60 tablet, Rfl: 0  EXAM:  VITALS per patient if applicable:  GENERAL: alert, oriented, appears well and in no acute distress  HEENT: atraumatic, conjunttiva clear, no obvious abnormalities on inspection of external nose and ears  NECK: normal movements of the head and neck  LUNGS: on inspection no signs of respiratory distress, breathing rate appears normal, no obvious gross SOB, gasping or wheezing  CV: no obvious cyanosis  MS: moves all visible extremities without noticeable abnormality  PSYCH/NEURO: pleasant and cooperative, speech and thought processing grossly intact. She appears to be quite anxious and almost tearful   ASSESSMENT AND PLAN: Anxiety disorder, she will try some Lorazepam as needed. She will stop the Zoloft, no need to taper off since she is on such a low dose. I asked her to follow up with Dr. Ethlyn Gallery next week.  Alysia Penna, MD  Discussed the following assessment and plan:  No diagnosis found.     I discussed the assessment and treatment plan with the patient. The patient was provided an opportunity to ask questions and all were answered. The patient agreed with the plan and  demonstrated an understanding of the instructions.   The patient was advised to call back or seek an in-person evaluation if the symptoms worsen or if the condition fails to improve as anticipated.     Review of Systems     Objective:   Physical Exam        Assessment & Plan:

## 2019-02-01 NOTE — Telephone Encounter (Signed)
Follow up scheduled for Wednesday.  Pt saw Dr. Sarajane Jews and was given ativan. She said she did not notice that the sig was as needed so she only took one last night. She still had heart palpitations with the ativan. States that it took a while for the ativan to kick in. She did have a panic attack after taking the medication but she was abe to work through it and get a good nights sleep.

## 2019-02-06 ENCOUNTER — Telehealth: Payer: Self-pay

## 2019-02-06 ENCOUNTER — Telehealth: Payer: Self-pay | Admitting: Family Medicine

## 2019-02-06 ENCOUNTER — Encounter: Payer: Self-pay | Admitting: Family Medicine

## 2019-02-06 DIAGNOSIS — R5383 Other fatigue: Secondary | ICD-10-CM

## 2019-02-06 DIAGNOSIS — R002 Palpitations: Secondary | ICD-10-CM

## 2019-02-06 DIAGNOSIS — E079 Disorder of thyroid, unspecified: Secondary | ICD-10-CM

## 2019-02-06 DIAGNOSIS — L80 Vitiligo: Secondary | ICD-10-CM

## 2019-02-06 DIAGNOSIS — D649 Anemia, unspecified: Secondary | ICD-10-CM

## 2019-02-06 NOTE — Telephone Encounter (Signed)
Restarted the zoloft - was having pain everywhere. Pain on left side; shifting over to right. Sometimes getting right hand numb, tingling.   Had night terrors on buspar.   Palpitations had gotten better, but then they came back. Still feels like she is moving when she is not moving.   Took half pill of ativan last night. Woke at 3-4am. Had hard time falling back to sleep. Took 1/4 of tablet at that time and then was in bed until 6:30. Will sleep through night with whole ativan.   Did get Hair testing done. 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 Still losing weight.   States there is also the "autoimmune issue" which can play on everything else. Vitiligo is what she is referring to. Started when she was a kid. Started with one spot on face. Also feels this has affected fertility.   Did see fertility specialist in past. They wanted to do fibroid surgery. They wanted to try in vitro. Getting ready to start period right now. Noted change in palpitations after ending last period. Was after she completed period that she started with palpitations again.   She is taking colostrum to help with going to bathroom, probiotic, iodine, multivitamin, iron - food based.   Feels like she has drastic body temperature changes in chest and back. Feels light headed with this. Does feel like she has difficulty with focus as well.

## 2019-02-06 NOTE — Telephone Encounter (Signed)
Copied from [place] [id number]. Topic: General - Other >> Feb 06, 2019 12:30 PM Rutherford Nail, NT wrote: Reason for CRM: Patient returning call to Dr Ethlyn Gallery or Ria Comment. Please advise.

## 2019-02-06 NOTE — Telephone Encounter (Signed)
Virtual Visit via Telephone Note  I connected with@ on @TODAY @ at  by telephone and verified that I am speaking with the correct person using two identifiers.   I discussed the limitations, risks, security and privacy concerns of performing an evaluation and management service by telephone and the availability of in person appointments. I also discussed with the patient that there may be a patient responsible charge related to this service. The patient expressed understanding and agreed to proceed.  Location patient: home/out for a walk Location provider: work office Participants present for the call: patient, provider Patient did not have a visit in the prior 7 days to address this/these issue(s).   History of Present Illness: Previous telephone note signed in error.   Tina Melendez had visit to discuss anxiety last week, but after mychart discussion she is presenting with concerns that there is more systemic issue going on. Has further physical concerns now that we are discussing in more detail today.  Restarted the zoloft - was having pain everywhere. Pain on left side; shifting over to right. Sometimes getting right hand numb, tingling. this is similar to pain that she was having/has had when presenting to ER in past.   Had night terrors on buspar.   Palpitations had gotten better, but then they came back. Still feels like she is moving when she is not moving. Somewhat like head spinning, but not fully spinning.  Took half pill of ativan last night. Woke at 3-4am. Had hard time falling back to sleep. Took 1/4 of tablet at that time and then was in bed until 6:30. Will sleep through night with whole ativan.   Did get Hair testing done. 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 Still losing weight. worried about amount of weight loss. She thinks she has lost somewhere close to 30 pounds since all of the symptoms started back before  December.  States there is also the "autoimmune issue" which can play on everything else. Vitiligo is what she is referring to. Started when she was a kid. Started with one spot on face.  Has gradually worsened since childhood.  Also feels this has affected fertility.   Did see fertility specialist in past. They wanted to do fibroid surgery. They wanted to try in vitro. Getting ready to start period right now. Noted change in palpitations after ending last period. Was after she completed period that she started with palpitations again.   She is taking colostrum to help with going to bathroom, probiotic, iodine, multivitamin, iron - food based.   Feels like she has drastic body temperature changes in chest and back. Feels light headed with this. Does feel like she has difficulty with focus as well. has had spikes in bp but not consistent.    Observations/Objective: Patient sounds cheerful and well on the phone. I do not appreciate any SOB. She is out walking currently. Speech and thought processing are grossly intact. Patient reported vitals: none  Assessment and Plan: 1. Palpitations Recurrent, persistent. Episodic. Associated with some heat intolerance. Will further eval thyroid.  2. Vitiligo This is a chronic issue.  She does have progression which is been occurring since childhood.  Due to association with thyroid disease, will pursue further thyroid evaluation.  3. Thyroid disease - TSH; Future - T4, free; Future - T3, free - Thyroid Peroxidase Antibody - Thyroglobulin Level  4. Other fatigue - Vitamin B12; Future - ANA; Future - C-reactive protein; Future - Sedimentation rate; Future  5. Anemia, unspecified type - CBC with Differential/Platelet; Future   Follow Up Instructions: I have sent a message to my medical assistant to call and schedule patient for blood work.  I think it is reasonable to research autoimmune issues especially thyroid, history of vitiligo and  ongoing palpitations.  Initially I saw [name], and on follow-up visits, it seemed that her symptoms were related to anxiety, and stresses in her current living situation.  However, palpitations seem to recur and she has not as she seemed to be a couple of weeks ago.  Symptoms do seem to be worse after menses,  Which may be related to anemia.  Unfortunately she did not follow through with a cardiology referral and Holter monitor order.  She has, however, had cardiology evaluation with EKGs while symptoms were present in the ER, and these have been within normal limits.  If blood work is all normal, I would still consider endocrinology referral due to persistent symptoms.  Of note, her anemia was somewhat improved on last set of blood work from February.  Also noted that heart was of normal size on CT of her chest.  Adrenal glands also appeared normal on CT of abdomen and pelvis. These were completed in Jan 2020.  I did not refer this patient for an OV in the next 24 hours for this/these issue(s).  I discussed the assessment and treatment plan with the patient. The patient was provided an opportunity to ask questions and all were answered. The patient agreed with the plan and demonstrated an understanding of the instructions.   The patient was advised to call back or seek an in-person evaluation if the symptoms worsen or if the condition fails to improve as anticipated.  I provided 25 minutes of non-face-to-face time during this encounter.   Micheline Rough, MD

## 2019-02-06 NOTE — Telephone Encounter (Signed)
Please advise. Did you get in touch with the patient?

## 2019-02-06 NOTE — Telephone Encounter (Signed)
I talked to her, but I wanted you to call and schedule her for bloodwork. She has since reported to me that she is still out of state; so not sure if she will be able to get this completed. I told her you could fax order to lab of her choice there; but she would have to investigate options. Please touch base with her for me and see what you guys can figure out. I would like to have this done since she is not feeling well.

## 2019-02-06 NOTE — Telephone Encounter (Signed)
Please set up call with Tina Melendez today for follow up on anxiety/ativan.

## 2019-02-07 NOTE — Telephone Encounter (Signed)
Hi Dr. Ethlyn Gallery,  This patient is self pay and she gets a discount through [facility]-  If we go by Medicare rules right now, we would bill with the telephone call [phone] due to the 25 min spent. Let me know if you have further questions. Dawn

## 2019-02-07 NOTE — Telephone Encounter (Signed)
Spoke with patient. She stated she is out of state right now and that she is concerned about exposure if [facility].  [facility] the [facility] to [facility] this [facility] or wait? Please advise.

## 2019-02-08 ENCOUNTER — Other Ambulatory Visit: Payer: Self-pay

## 2019-02-08 ENCOUNTER — Ambulatory Visit: Payer: Self-pay | Admitting: Family Medicine

## 2019-02-08 NOTE — Telephone Encounter (Signed)
Pt returned call to [place].  Please call pt: [phone]

## 2019-02-08 NOTE — Telephone Encounter (Signed)
Hi Dr Ethlyn Gallery,    Can you please se s the orders to the [facility] listed below and I also messaged Mendel Ryder on here to get her to email the labs to me as well for a secondary copy just in case please. Thank you so much!    [address] [place], [place], [place] [address]    [place] - copied from [place] message. Please get her extra copy of lab ordered mailed if possible so she has hard copy along with faxing to [facility] and touch base with her once completed. Thanks!

## 2019-02-08 NOTE — Telephone Encounter (Signed)
Spoke with the patient she stated she will call [facility] to she what precautions they are taking. She is also waiting for her mask to come in. She stated that once she speaks with [facility] she will send [place] a mychart message.

## 2019-02-08 NOTE — Telephone Encounter (Signed)
Noted  

## 2019-02-08 NOTE — Telephone Encounter (Signed)
I do agree with limiting exposure if possible, however I was worried about her worsening and continued symptoms. I'm not sure what precautions [facility] is taking right now to limit exposure. It might be that they have a good system set up (ie maybe they are drawing at car or limiting draws to healthy patients only) so it is worth looking into.   If she doesn't feel safe, then next step is up to her. We are open for lab draws, so if coming back to this area anytime in future we could set that up for her. If she doesn't feel that this is possible, then we are limited to continuing to try and treat current symptoms, but if she is feeling that symptoms are worsening, and that there is more going on than a simple anxiety then I feel we are just putting bandaids on symptoms without knowing full story, which would not be my preference.  Happy to work with her through whatever she decides. Let me know.

## 2019-02-09 ENCOUNTER — Other Ambulatory Visit: Payer: Self-pay | Admitting: Family Medicine

## 2019-02-09 DIAGNOSIS — D649 Anemia, unspecified: Secondary | ICD-10-CM

## 2019-02-09 NOTE — Addendum Note (Signed)
Addended by: Rebecca Eaton on: 02/09/2019 01:40 PM   Modules accepted: Orders

## 2019-02-09 NOTE — Addendum Note (Signed)
Addended by: Elmer Picker on: 02/09/2019 01:56 PM   Modules accepted: Orders

## 2019-02-09 NOTE — Telephone Encounter (Signed)
I am going to try and consolidate the two message I have here.   Labs are in; just print lab order and it can be faxed to the [facility] location. Lab info was sent to you earlier in this message chain.  I have talked through symptoms with her. She is out of state and I do not feel like there is much else remotely that I can do at this point. If she feels that her symptoms are worsening she will need to be seen at urgent care or ER and get a hands on visit. If you can get lab order faxed over today she should be able to complete these and we should started getting results within a couple of days; but as I told her some of the labs take longer to come back (autoimmune). If she feels she has had a change in condition but not feeling like she wants to go to urgent care/ER and would like to have web visit, I do believe there are some openings with other providers today who can give additional thoughts/recommendations.

## 2019-02-09 NOTE — Addendum Note (Signed)
Addended by: Elmer Picker on: 02/09/2019 02:26 PM   Modules accepted: Orders

## 2019-02-09 NOTE — Addendum Note (Signed)
Addended by: Rebecca Eaton on: 02/09/2019 01:44 PM   Modules accepted: Orders

## 2019-02-09 NOTE — Telephone Encounter (Signed)
Spoke with the patient over the phone. She is aware that Dr. Ethlyn Gallery is out of the office today and I am waiting to see which labs need to be sent over to [facility]. The patient was very distraught on the phone stating " this is not all anxiety. The Zoloft is not working and something else is going on. I'm so tired of feeling bad and I feel like something catastrophic is going to happen to me before I can get anything figured out."

## 2019-02-09 NOTE — Telephone Encounter (Signed)
Replied to message in other screen.

## 2019-02-09 NOTE — Telephone Encounter (Signed)
I have been in touch with the patient through [place].

## 2019-02-13 ENCOUNTER — Encounter: Payer: Self-pay | Admitting: Family Medicine

## 2019-02-15 ENCOUNTER — Other Ambulatory Visit: Payer: Self-pay | Admitting: Family Medicine

## 2019-02-15 MED ORDER — TRAZODONE HCL 50 MG PO TABS: 25.0000 mg | ORAL_TABLET | Freq: Every evening | ORAL | 2 refills | Status: DC | PRN

## 2019-02-15 MED ORDER — SERTRALINE HCL 50 MG PO TABS: 50.0000 mg | ORAL_TABLET | Freq: Every day | ORAL | 2 refills | Status: AC

## 2019-02-22 ENCOUNTER — Encounter: Payer: Self-pay | Admitting: Family Medicine

## 2019-02-27 ENCOUNTER — Encounter: Payer: Self-pay | Admitting: Family Medicine

## 2019-03-07 ENCOUNTER — Inpatient Hospital Stay
Admit: 2019-03-07 | Discharge: 2019-03-08 | Disposition: A | Discharge status: Home / Self Care | Payer: Self-pay | Attending: Emergency Medicine

## 2019-03-07 ENCOUNTER — Emergency Department: Admit: 2019-03-08 | Payer: Self-pay | Primary: Family

## 2019-03-07 ENCOUNTER — Emergency Department: Payer: Self-pay | Primary: Family

## 2019-03-07 ENCOUNTER — Emergency Department: Admit: 2019-03-07 | Payer: Self-pay | Primary: Family

## 2019-03-07 DIAGNOSIS — R0602 Shortness of breath: Secondary | ICD-10-CM

## 2019-03-07 LAB — TROPONIN: Troponin I: <0.02 NG/ML — ABNORMAL LOW (ref 0.02–0.05)

## 2019-03-07 LAB — COMPREHENSIVE METABOLIC PANEL
ALT: 19 U/L (ref 12–65)
AST: 11 U/L — ABNORMAL LOW (ref 15–37)
Albumin/Globulin Ratio: 0.9 — ABNORMAL LOW (ref 1.2–3.5)
Albumin: 3.7 g/dL (ref 3.5–5.0)
Alkaline Phosphatase: 85 U/L (ref 50–136)
Anion Gap: 8 mmol/L (ref 7–16)
BUN: 16 MG/DL (ref 6–23)
CO2: 25 mmol/L (ref 21–32)
Calcium: 9.1 MG/DL (ref 8.3–10.4)
Chloride: 105 mmol/L (ref 98–107)
Creatinine: 0.81 MG/DL (ref 0.6–1.0)
EGFR IF NonAfrican American: >60 mL/min/{1.73_m2} (ref >60)
GFR African American: >60 mL/min/{1.73_m2} (ref >60)
Globulin: 4.1 g/dL — ABNORMAL HIGH (ref 2.3–3.5)
Glucose: 84 mg/dL (ref 65–100)
Potassium: 3.7 mmol/L (ref 3.5–5.1)
Sodium: 138 mmol/L (ref 136–145)
Total Bilirubin: 0.2 MG/DL (ref 0.2–1.1)
Total Protein: 7.8 g/dL (ref 6.3–8.2)

## 2019-03-07 LAB — TSH 3RD GENERATION
TSH: 0.961 u[IU]/mL (ref 0.358–3.740)
TSH: 0.961 u[IU]/mL (ref 0.358–3.740)

## 2019-03-07 LAB — METABOLIC PANEL, COMPREHENSIVE
A-G Ratio: 0.9 — ABNORMAL LOW (ref 1.2–3.5)
ALT (SGPT): 19 U/L (ref 12–65)
AST (SGOT): 11 U/L — ABNORMAL LOW (ref 15–37)
Albumin: 3.7 g/dL (ref 3.5–5.0)
Alk. phosphatase: 85 U/L (ref 50–136)
Anion gap: 8 mmol/L (ref 7–16)
BUN: 16 MG/DL (ref 6–23)
Bilirubin, total: 0.2 MG/DL (ref 0.2–1.1)
CO2: 25 mmol/L (ref 21–32)
Calcium: 9.1 MG/DL (ref 8.3–10.4)
Chloride: 105 mmol/L (ref 98–107)
Creatinine: 0.81 MG/DL (ref 0.6–1.0)
GFR est AA: >60 mL/min/{1.73_m2} (ref >60)
GFR est non-AA: >60 mL/min/{1.73_m2} (ref >60)
Globulin: 4.1 g/dL — ABNORMAL HIGH (ref 2.3–3.5)
Glucose: 84 mg/dL (ref 65–100)
Potassium: 3.7 mmol/L (ref 3.5–5.1)
Protein, total: 7.8 g/dL (ref 6.3–8.2)
Sodium: 138 mmol/L (ref 136–145)

## 2019-03-07 LAB — TROPONIN I: Troponin-I, Qt.: <0.02 NG/ML — ABNORMAL LOW (ref 0.02–0.05)

## 2019-03-07 MED ORDER — IOPAMIDOL 76 % IV SOLN
76 % | Freq: Once | INTRAVENOUS | Status: DC
Start: 2019-03-07 — End: 2019-03-08
  Administered 2019-03-07: via INTRAVENOUS

## 2019-03-07 MED ORDER — SALINE PERIPHERAL FLUSH PRN
Freq: Once | INTRAMUSCULAR | Status: DC
Start: 2019-03-07 — End: 2019-03-08
  Administered 2019-03-07

## 2019-03-07 MED ORDER — IPRATROPIUM-ALBUTEROL 2.5 MG-0.5 MG/3 ML NEB SOLUTION
2.5 mg-0.5 mg/3 ml | RESPIRATORY_TRACT | Status: AC
Start: 2019-03-07 — End: 2019-03-07
  Administered 2019-03-08: via RESPIRATORY_TRACT

## 2019-03-07 MED ORDER — SODIUM CHLORIDE 0.9% BOLUS IV
0.9 % | Freq: Once | INTRAVENOUS | Status: DC
Start: 2019-03-07 — End: 2019-03-08
  Administered 2019-03-07: via INTRAVENOUS

## 2019-03-07 MED FILL — IPRATROPIUM-ALBUTEROL 2.5 MG-0.5 MG/3 ML NEB SOLUTION: 2.5 mg-0.5 mg/3 ml | RESPIRATORY_TRACT | Qty: 3

## 2019-03-07 NOTE — ED Notes (Signed)
Pt to CT

## 2019-03-07 NOTE — ED Notes (Signed)
Lab called for carboxy hbg

## 2019-03-07 NOTE — ED Notes (Signed)
Pt refusing CT at this time due to contrast dye reaction. MD aware

## 2019-03-07 NOTE — Progress Notes (Signed)
Spoke to Dr Delia Chimes. Does not need the carboxy hemoglobin.

## 2019-03-07 NOTE — ED Triage Notes (Signed)
Pt reports SOB for several months, also reports substernal chest pain for months. States that she has lost about 30 lbs. Denies fever, chills or cough. Pt masked in triage.

## 2019-03-07 NOTE — ED Provider Notes (Signed)
38 year old female presenting for a constellation of symptoms that are difficult to associate.  She reports that she has had 5 months of worsening dyspnea upon exertion.  She is gotten to a point now where she feels like she cannot complete tasks at home without becoming short of breath.  Before December she never had anything like this.  She is been seen by her primary doctor who felt that this was anxiety related and she feels that it is not.  She thinks there is something wrong with her blood.  She also reports weight loss since December without trying.  She was seen recently and had blood work drawn and she is concerned that her iron saturations low and that some other portions of her blood work seemed off.  She points them to me and shows me that these are her RDW and other red corpuscular measurements.  She denies fevers and chills.  She has had no cough.  She does not take any other medications.  She reports that she reached out to a physician about getting an iron infusion but was unable to follow-up today.      Chest Pain (Angina)    Associated symptoms include shortness of breath.   Shortness of Breath   Associated symptoms include chest pain.        Past Medical History:   Diagnosis Date   ??? Autosomal dominant congenital non-nuclear cataract    ??? 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 Vitiligo        Past Surgical History:   Procedure Laterality Date   ??? HX GYN      D&C   ??? HX WISDOM TEETH EXTRACTION      x1 left bottom         Family History:   Problem Relation Age of Onset   ??? Celiac Disease Mother    ??? Diabetes Mother    ??? Hypertension Mother    ??? Hypertension Father    ??? No Known Problems Sister    ??? Asthma Brother    ??? Hypertension Maternal Grandmother    ??? Diabetes Maternal Grandmother    ??? No Known Problems Maternal Grandfather    ??? Breast Cancer Paternal Grandmother    ??? Diabetes Paternal Grandmother    ??? Hypertension Paternal Grandmother    ??? Heart Attack Paternal Grandfather         Social History     Socioeconomic History   ??? Marital status: MARRIED     Spouse name: Not on file   ??? Number of children: Not on file   ??? Years of education: Not on file   ??? Highest education level: Not on file   Occupational History   ??? Not on file   Social Needs   ??? Financial resource strain: Not on file   ??? Food insecurity     Worry: Not on file     Inability: Not on file   ??? Transportation needs     Medical: Not on file     Non-medical: Not on file   Tobacco Use   ??? Smoking status: Former Smoker     Last attempt to quit: 05/27/2015     Years since quitting: 3.7   ??? Smokeless tobacco: Never Used   Substance and Sexual Activity   ??? Alcohol use: Yes     Alcohol/week: 7.0 standard drinks     Types: 3 Glasses of wine, 4 Shots of liquor per week   ???  Drug use: No   ??? Sexual activity: Yes     Partners: Male     Birth control/protection: None   Lifestyle   ??? Physical activity     Days per week: Not on file     Minutes per session: Not on file   ??? Stress: Not on file   Relationships   ??? Social Product manager on phone: Not on file     Gets together: Not on file     Attends religious service: Not on file     Active member of club or organization: Not on file     Attends meetings of clubs or organizations: Not on file     Relationship status: Not on file   ??? Intimate partner violence     Fear of current or ex partner: Not on file     Emotionally abused: Not on file     Physically abused: Not on file     Forced sexual activity: Not on file   Other Topics Concern   ??? Not on file   Social History Narrative   ??? Not on file         ALLERGIES: Macrobid [nitrofurantoin monohyd/m-cryst]    Review of Systems   Respiratory: Positive for shortness of breath.    Cardiovascular: Positive for chest pain.   All other systems reviewed and are negative.      Vitals:    03/07/19 1814 03/07/19 1925 03/07/19 1926   BP: (!) 144/96 138/67    Pulse: 63 75    Resp: 20     Temp: 98 ??F (36.7 ??C)     SpO2: 100% 99% 98%    Weight: 65.8 kg (145 lb)     Height: '5\' 7"'$  (1.702 m)              Physical Exam  Vitals signs and nursing note reviewed.   Constitutional:       Appearance: She is well-developed.   HENT:      Head: Normocephalic and atraumatic.   Eyes:      Conjunctiva/sclera: Conjunctivae normal.      Pupils: Pupils are equal, round, and reactive to light.   Neck:      Musculoskeletal: Neck supple.   Cardiovascular:      Rate and Rhythm: Regular rhythm. Tachycardia present.      Heart sounds: Normal heart sounds.   Pulmonary:      Effort: Pulmonary effort is normal.      Breath sounds: Examination of the right-upper field reveals wheezing. Examination of the left-upper field reveals wheezing. Examination of the right-middle field reveals wheezing. Examination of the left-middle field reveals wheezing. Examination of the right-lower field reveals wheezing. Examination of the left-lower field reveals wheezing. Wheezing present.      Comments: Mild wheezing in all lung fields but no accessory muscle usage or prolonged expiratory time  Abdominal:      General: Bowel sounds are normal.      Palpations: Abdomen is soft.   Musculoskeletal: Normal range of motion.   Skin:     General: Skin is warm and dry.   Neurological:      Mental Status: She is alert and oriented to person, place, and time.          MDM  Number of Diagnoses or Management Options  Diagnosis management comments: 38 year old female presenting for worsening shortness of breath over the past several months.  Patient presents to me  blood work that she had performed as an outpatient and essentially is unremarkable.  She had a hemoglobin of 11-1/2.  She is concerned about her iron saturation being low and her weight loss.  Considerations could be cancer, hypothyroidism, conversion disorder, undiagnosed thalassemia.       Amount and/or Complexity of Data Reviewed  Clinical lab tests: ordered and reviewed (Results for orders placed or  performed during the hospital encounter of 03/07/19  -CBC WITH AUTOMATED DIFF       Result                      Value             Ref Range           WBC                         8.3               4.3 - 11.1 K*       RBC                         5.16              4.05 - 5.2 M*       HGB                         11.5 (L)          11.7 - 15.4 *       HCT                         36.5              35.8 - 46.3 %       MCV                         70.7 (L)          79.6 - 97.8 *       MCH                         22.3 (L)          26.1 - 32.9 *       MCHC                        31.5              31.4 - 35.0 *       RDW                         15.3 (H)          11.9 - 14.6 %       PLATELET                    177               150 - 450 K/*       MPV  9.4 - 12.3 FL   Unable to calculate. Recommend adding IPF.       ABSOLUTE NRBC               0.00              0.0 - 0.2 K/*       NEUTROPHILS                 62                43 - 78 %           LYMPHOCYTES                 26                13 - 44 %           MONOCYTES                   7                 4.0 - 12.0 %        EOSINOPHILS                 4                 0.5 - 7.8 %         BASOPHILS                   1                 0.0 - 2.0 %         IMMATURE GRANULOCYTES       0                 0.0 - 5.0 %         ABS. NEUTROPHILS            5.1               1.7 - 8.2 K/*       ABS. LYMPHOCYTES            2.2               0.5 - 4.6 K/*       ABS. MONOCYTES              0.6               0.1 - 1.3 K/*       ABS. EOSINOPHILS            0.3               0.0 - 0.8 K/*       ABS. BASOPHILS              0.1               0.0 - 0.2 K/*       ABS. IMM. GRANS.            0.0               0.0 - 0.5 K/*       RBC COMMENTS  SLIGHT   ANISOCYTOSIS + POIKILOCYTOSIS          WBC COMMENTS                                                  Result Confirmed By Smear        PLATELET COMMENTS           ADEQUATE                              DF                          AUTOMATED                        -METABOLIC PANEL, COMPREHENSIVE       Result                      Value             Ref Range           Sodium                      138               136 - 145 mm*       Potassium                   3.7               3.5 - 5.1 mm*       Chloride                    105               98 - 107 mmo*       CO2                         25                21 - 32 mmol*       Anion gap                   8                 7 - 16 mmol/L       Glucose                     84                65 - 100 mg/*       BUN                         16                6 - 23 MG/DL        Creatinine                  0.81              0.6 - 1.0 MG*  GFR est AA                  >60               >60 ml/min/1*       GFR est non-AA              >60               >60 ml/min/1*       Calcium                     9.1               8.3 - 10.4 M*       Bilirubin, total            0.2               0.2 - 1.1 MG*       ALT (SGPT)                  19                12 - 65 U/L         AST (SGOT)                  11 (L)            15 - 37 U/L         Alk. phosphatase            85                50 - 136 U/L        Protein, total              7.8               6.3 - 8.2 g/*       Albumin                     3.7               3.5 - 5.0 g/*       Globulin                    4.1 (H)           2.3 - 3.5 g/*       A-G Ratio                   0.9 (L)           1.2 - 3.5      -TROPONIN I       Result                      Value             Ref Range           Troponin-I, Qt.             <0.02 (L)         0.02 - 0.05 *  -TSH 3RD GENERATION       Result                      Value             Ref Range  TSH                         0.961             0.358 - 3.74*  -POC G3       Result                      Value             Ref Range           Device:                     ROOM AIR                               pH (POC)                    7.420             7.35 - 7.45         pCO2 (POC)                  35.9              35 - 45 MMHG        pO2 (POC)                   99                75 - 100 MMHG       HCO3 (POC)                  23.3              22 - 26 MMOL*       sO2 (POC)                   98                95 - 98 %           Base deficit (POC)          1                 mmol/L              Allens test (POC)           YES                                   Site                        LEFT RADIAL                           Specimen type (POC)         ARTERIAL                              Performed by  [facility]       CO2, POC                    24                MMOL/L              Respiratory comment:        NurseNotified                         COLLECT TIME                2,000                            )  Tests in the radiology section of CPT??: ordered and reviewed (Xr Chest Pa Lat    Result Date: 03/07/2019  CHEST X-RAY, 2 views. HISTORY:  Chest pain and shortness breath. TECHNIQUE: AP and lateral views. COMPARISON: March 2020. FINDINGS: -Lungs: are clear. -Heart size: Normal. -Costophrenic angles: are sharp. -Pulmonary vasculature: is unremarkable. -Included portion of the upper abdomen: is unremarkable. -Bones: No gross bony lesions. -Other: None.     IMPRESSION: Negative for acute abnormality.     Ct Chest Wo Cont    Result Date: 03/07/2019  EXAM: Noncontrast CT chest. INDICATION: Chest pain and dyspnea. COMPARISON: Earlier chest x-ray on the same day. TECHNIQUE: Axial noncontrast CT images of the chest were obtained.  Radiation dose reduction techniques were used for this study.  Our CT scanners use one or all of the following:  Automated exposure control, adjustment of the mA and/or kV according to patient size, iterative reconstruction. FINDINGS: The lungs are clear. The heart and thoracic aorta are normal in appearance  on this noncontrast study. No mediastinal mass, lymphadenopathy, pneumothorax, pleural or pericardial effusion is identified. The bony structures and visualized uppermost abdomen are within normal limits.     IMPRESSION: Unremarkable noncontrast CT chest.     )  Tests in the medicine section of CPT??: ordered and reviewed  Independent visualization of images, tracings, or specimens: yes (Personally reviewed the patient's chest x-ray and CT)    Risk of Complications, Morbidity, and/or Mortality  Presenting problems: high  Diagnostic procedures: high  Management options: moderate  General comments: At this point I see no shortness of breath.  Her blood gas is normal, chest x-ray and head CT urine normal, blood work is normal.  Could be an underlying rheumatologic disorder and I am not concerned the patient has a PE as she is not tachycardic and she is not hypoxic.    I personally reviewed the patient's vital signs, laboratory tests, and/or radiological findings.  I discussed these findings with the patient and their significance.  I answered all questions and gave the patient clear return precautions.  The patient was discharged from the emergency department in stable condition        Patient Progress  Patient progress: stable    ED Course as of Mar 06 2117   Tue Mar 07, 2019   3329 Patient refusing her chest CT.    [JS]   2024 Sent and spoke with patient regarding her findings.  So far we have found no significant abnormality that would explain the patient's symptoms but I did express to her that we would not find signs of connective tissue and rheumatologic diseases such as lupus, rheumatoid arthritis, sarcoidosis, etc. here  in the emergency department.  Patient expressed understanding    [JS]      ED Course User Index  [JS] Synetta Fail, MD       Procedures

## 2019-03-07 NOTE — ED Notes (Signed)
I have reviewed discharge instructions with the patient.  The patient verbalized understanding.    Patient left ED via Discharge Method: ambulatory to Home with friend    Opportunity for questions and clarification provided.       Patient given 0 scripts. Pt assisted to lobby via wheelchair. Pt in no acute distress at time of d/c        To continue your aftercare when you leave the hospital, you may receive an automated call from our care team to check in on how you are doing.  This is a free service and part of our promise to provide the best care and service to meet your aftercare needs.??? If you have questions, or wish to unsubscribe from this service please call [phone].  Thank you for Choosing our [facility] Emergency Department.

## 2019-03-07 NOTE — ED Notes (Signed)
Respiratory called for ABG and duo neb

## 2019-03-07 NOTE — ED Notes (Signed)
Lab called for TSH add on

## 2019-03-07 NOTE — ED Provider Notes (Signed)
38 year old female presenting for a constellation of symptoms that are difficult to associate.  She reports that she has had 5 months of worsening dyspnea upon exertion.  She is gotten to a point now where she feels like she cannot complete tasks at home without becoming short of breath.  Before December she never had anything like this.  She is been seen by her primary doctor who felt that this was anxiety related and she feels that it is not.  She thinks there is something wrong with her blood.  She also reports weight loss since December without trying.  She was seen recently and had blood work drawn and she is concerned that her iron saturations low and that some other portions of her blood work seemed off.  She points them to me and shows me that these are her RDW and other red corpuscular measurements.  She denies fevers and chills.  She has had no cough.  She does not take any other medications.  She reports that she reached out to a physician about getting an iron infusion but was unable to follow-up today.      Chest Pain (Angina)    Associated symptoms include shortness of breath.   Shortness of Breath   Associated symptoms include chest pain.        Past Medical History:   Diagnosis Date   ??? Autosomal dominant congenital non-nuclear cataract    ??? 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 Vitiligo        Past Surgical History:   Procedure Laterality Date   ??? HX GYN      D&C   ??? HX WISDOM TEETH EXTRACTION      x1 left bottom         Family History:   Problem Relation Age of Onset   ??? Celiac Disease Mother    ??? Diabetes Mother    ??? Hypertension Mother    ??? Hypertension Father    ??? No Known Problems Sister    ??? Asthma Brother    ??? Hypertension Maternal Grandmother    ??? Diabetes Maternal Grandmother    ??? No Known Problems Maternal Grandfather    ??? Breast Cancer Paternal Grandmother    ??? Diabetes Paternal Grandmother    ??? Hypertension Paternal Grandmother    ??? Heart Attack Paternal Grandfather         Social History     Socioeconomic History   ??? Marital status: MARRIED     Spouse name: Not on file   ??? Number of children: Not on file   ??? Years of education: Not on file   ??? Highest education level: Not on file   Occupational History   ??? Not on file   Social Needs   ??? Financial resource strain: Not on file   ??? Food insecurity     Worry: Not on file     Inability: Not on file   ??? Transportation needs     Medical: Not on file     Non-medical: Not on file   Tobacco Use   ??? Smoking status: Former Smoker     Last attempt to quit: 05/27/2015     Years since quitting: 3.7   ??? Smokeless tobacco: Never Used   Substance and Sexual Activity   ??? Alcohol use: Yes     Alcohol/week: 7.0 standard drinks     Types: 3 Glasses of wine, 4 Shots of liquor per week   ???  Drug use: No   ??? Sexual activity: Yes     Partners: Male     Birth control/protection: None   Lifestyle   ??? Physical activity     Days per week: Not on file     Minutes per session: Not on file   ??? Stress: Not on file   Relationships   ??? Social Product manager on phone: Not on file     Gets together: Not on file     Attends religious service: Not on file     Active member of club or organization: Not on file     Attends meetings of clubs or organizations: Not on file     Relationship status: Not on file   ??? Intimate partner violence     Fear of current or ex partner: Not on file     Emotionally abused: Not on file     Physically abused: Not on file     Forced sexual activity: Not on file   Other Topics Concern   ??? Not on file   Social History Narrative   ??? Not on file         ALLERGIES: Macrobid [nitrofurantoin monohyd/m-cryst]    Review of Systems   Respiratory: Positive for shortness of breath.    Cardiovascular: Positive for chest pain.   All other systems reviewed and are negative.      Vitals:    03/07/19 1814 03/07/19 1925 03/07/19 1926   BP: (!) 144/96 138/67    Pulse: 63 75    Resp: 20     Temp: 98 ??F (36.7 ??C)     SpO2: 100% 99% 98%   Weight: 65.8 kg  (145 lb)     Height: '5\' 7"'  (1.702 m)              Physical Exam  Vitals signs and nursing note reviewed.   Constitutional:       Appearance: She is well-developed.   HENT:      Head: Normocephalic and atraumatic.   Eyes:      Conjunctiva/sclera: Conjunctivae normal.      Pupils: Pupils are equal, round, and reactive to light.   Neck:      Musculoskeletal: Neck supple.   Cardiovascular:      Rate and Rhythm: Regular rhythm. Tachycardia present.      Heart sounds: Normal heart sounds.   Pulmonary:      Effort: Pulmonary effort is normal.      Breath sounds: Examination of the right-upper field reveals wheezing. Examination of the left-upper field reveals wheezing. Examination of the right-middle field reveals wheezing. Examination of the left-middle field reveals wheezing. Examination of the right-lower field reveals wheezing. Examination of the left-lower field reveals wheezing. Wheezing present.      Comments: Mild wheezing in all lung fields but no accessory muscle usage or prolonged expiratory time  Abdominal:      General: Bowel sounds are normal.      Palpations: Abdomen is soft.   Musculoskeletal: Normal range of motion.   Skin:     General: Skin is warm and dry.   Neurological:      Mental Status: She is alert and oriented to person, place, and time.          MDM  Number of Diagnoses or Management Options  Diagnosis management comments: 38 year old female presenting for worsening shortness of breath over the past several months.  Patient presents to me  blood work that she had performed as an outpatient and essentially is unremarkable.  She had a hemoglobin of 11-1/2.  She is concerned about her iron saturation being low and her weight loss.  Considerations could be cancer, hypothyroidism, conversion disorder, undiagnosed thalassemia.       Amount and/or Complexity of Data Reviewed  Clinical lab tests: ordered and reviewed (Results for orders placed or performed during the hospital encounter of 03/07/19  -CBC  WITH AUTOMATED DIFF       Result                      Value             Ref Range           WBC                         8.3               4.3 - 11.1 K*       RBC                         5.16              4.05 - 5.2 M*       HGB                         11.5 (L)          11.7 - 15.4 *       HCT                         36.5              35.8 - 46.3 %       MCV                         70.7 (L)          79.6 - 97.8 *       MCH                         22.3 (L)          26.1 - 32.9 *       MCHC                        31.5              31.4 - 35.0 *       RDW                         15.3 (H)          11.9 - 14.6 %       PLATELET                    177               150 - 450 K/*       MPV  9.4 - 12.3 FL   Unable to calculate. Recommend adding IPF.       ABSOLUTE NRBC               0.00              0.0 - 0.2 K/*       NEUTROPHILS                 62                43 - 78 %           LYMPHOCYTES                 26                13 - 44 %           MONOCYTES                   7                 4.0 - 12.0 %        EOSINOPHILS                 4                 0.5 - 7.8 %         BASOPHILS                   1                 0.0 - 2.0 %         IMMATURE GRANULOCYTES       0                 0.0 - 5.0 %         ABS. NEUTROPHILS            5.1               1.7 - 8.2 K/*       ABS. LYMPHOCYTES            2.2               0.5 - 4.6 K/*       ABS. MONOCYTES              0.6               0.1 - 1.3 K/*       ABS. EOSINOPHILS            0.3               0.0 - 0.8 K/*       ABS. BASOPHILS              0.1               0.0 - 0.2 K/*       ABS. IMM. GRANS.            0.0               0.0 - 0.5 K/*       RBC COMMENTS  SLIGHT   ANISOCYTOSIS + POIKILOCYTOSIS          WBC COMMENTS                                                  Result Confirmed By Smear       PLATELET COMMENTS           ADEQUATE                              DF                           AUTOMATED                        -METABOLIC PANEL, COMPREHENSIVE       Result                      Value             Ref Range           Sodium                      138               136 - 145 mm*       Potassium                   3.7               3.5 - 5.1 mm*       Chloride                    105               98 - 107 mmo*       CO2                         25                21 - 32 mmol*       Anion gap                   8                 7 - 16 mmol/L       Glucose                     84                65 - 100 mg/*       BUN                         16                6 - 23 MG/DL        Creatinine                  0.81              0.6 - 1.0 MG*  GFR est AA                  >60               >60 ml/min/1*       GFR est non-AA              >60               >60 ml/min/1*       Calcium                     9.1               8.3 - 10.4 M*       Bilirubin, total            0.2               0.2 - 1.1 MG*       ALT (SGPT)                  19                12 - 65 U/L         AST (SGOT)                  11 (L)            15 - 37 U/L         Alk. phosphatase            85                50 - 136 U/L        Protein, total              7.8               6.3 - 8.2 g/*       Albumin                     3.7               3.5 - 5.0 g/*       Globulin                    4.1 (H)           2.3 - 3.5 g/*       A-G Ratio                   0.9 (L)           1.2 - 3.5      -TROPONIN I       Result                      Value             Ref Range           Troponin-I, Qt.             <0.02 (L)         0.02 - 0.05 *  -TSH 3RD GENERATION       Result                      Value             Ref Range  TSH                         0.961             0.358 - 3.74*  -POC G3       Result                      Value             Ref Range           Device:                     ROOM AIR                              pH (POC)                    7.420             7.35 - 7.45         pCO2 (POC)                  35.9              35 -  45 MMHG        pO2 (POC)                   99                75 - 100 MMHG       HCO3 (POC)                  23.3              22 - 26 MMOL*       sO2 (POC)                   98                95 - 98 %           Base deficit (POC)          1                 mmol/L              Allens test (POC)           YES                                   Site                        LEFT RADIAL                           Specimen type (POC)         ARTERIAL                              Performed by  [facility]       CO2, POC                    24                MMOL/L              Respiratory comment:        NurseNotified                         COLLECT TIME                2,000                            )  Tests in the radiology section of CPT??: ordered and reviewed (Xr Chest Pa Lat    Result Date: 03/07/2019  CHEST X-RAY, 2 views. HISTORY:  Chest pain and shortness breath. TECHNIQUE: AP and lateral views. COMPARISON: March 2020. FINDINGS: -Lungs: are clear. -Heart size: Normal. -Costophrenic angles: are sharp. -Pulmonary vasculature: is unremarkable. -Included portion of the upper abdomen: is unremarkable. -Bones: No gross bony lesions. -Other: None.     IMPRESSION: Negative for acute abnormality.     Ct Chest Wo Cont    Result Date: 03/07/2019  EXAM: Noncontrast CT chest. INDICATION: Chest pain and dyspnea. COMPARISON: Earlier chest x-ray on the same day. TECHNIQUE: Axial noncontrast CT images of the chest were obtained.  Radiation dose reduction techniques were used for this study.  Our CT scanners use one or all of the following:  Automated exposure control, adjustment of the mA and/or kV according to patient size, iterative reconstruction. FINDINGS: The lungs are clear. The heart and thoracic aorta are normal in appearance on this noncontrast study. No mediastinal mass, lymphadenopathy, pneumothorax, pleural or pericardial effusion is identified. The bony structures and  visualized uppermost abdomen are within normal limits.     IMPRESSION: Unremarkable noncontrast CT chest.     )  Tests in the medicine section of CPT??: ordered and reviewed  Independent visualization of images, tracings, or specimens: yes (Personally reviewed the patient's chest x-ray and CT)    Risk of Complications, Morbidity, and/or Mortality  Presenting problems: high  Diagnostic procedures: high  Management options: moderate  General comments: At this point I see no shortness of breath.  Her blood gas is normal, chest x-ray and head CT urine normal, blood work is normal.  Could be an underlying rheumatologic disorder and I am not concerned the patient has a PE as she is not tachycardic and she is not hypoxic.    I personally reviewed the patient's vital signs, laboratory tests, and/or radiological findings.  I discussed these findings with the patient and their significance.  I answered all questions and gave the patient clear return precautions.  The patient was discharged from the emergency department in stable condition        Patient Progress  Patient progress: stable    ED Course as of Mar 06 2117   Tue Mar 07, 2019   1610 Patient refusing her chest CT.    [JS]   2024 Sent and spoke with patient regarding her findings.  So far we have found no significant abnormality that would explain the patient's symptoms but I did express to her that we would not find signs of connective tissue and rheumatologic diseases such as lupus, rheumatoid arthritis, sarcoidosis, etc. here  in the emergency department.  Patient expressed understanding    [JS]      ED Course User Index  [JS] Synetta Fail, MD       Procedures

## 2019-03-07 NOTE — ED Notes (Signed)
I have reviewed discharge instructions with the patient.  The patient verbalized understanding.    Patient left ED via Discharge Method: ambulatory to Home with friend    Opportunity for questions and clarification provided.       Patient given 0 scripts. Pt assisted to lobby via wheelchair. Pt in no acute distress at time of d/c        To continue your aftercare when you leave the hospital, you may receive an automated call from our care team to check in on how you are doing.  This is a free service and part of our promise to provide the best care and service to meet your aftercare needs." If you have questions, or wish to unsubscribe from this service please call [phone].  Thank you for Choosing our [facility] Emergency Department.

## 2019-03-07 NOTE — ED Notes (Signed)
Pt to CT

## 2019-03-07 NOTE — ED Notes (Signed)
Pt reports SOB for several months, also reports substernal chest pain for months. States that she has lost about 30 lbs. Denies fever, chills or cough. Pt masked in triage.

## 2019-03-08 LAB — EKG 12-LEAD
Atrial Rate: 69 {beats}/min
Diagnosis: NORMAL
P Axis: 67 degrees
P-R Interval: 166 ms
Q-T Interval: 394 ms
QRS Duration: 84 ms
QTc Calculation (Bazett): 422 ms
R Axis: -9 degrees
T Axis: 64 degrees
Ventricular Rate: 69 {beats}/min

## 2019-03-08 LAB — CBC WITH AUTO DIFFERENTIAL
Basophils %: 1 % (ref 0.0–2.0)
Basophils Absolute: 0.1 10*3/uL (ref 0.0–0.2)
Eosinophils %: 4 % (ref 0.5–7.8)
Eosinophils Absolute: 0.3 10*3/uL (ref 0.0–0.8)
Granulocyte Absolute Count: 0 10*3/uL (ref 0.0–0.5)
Hematocrit: 36.5 % (ref 35.8–46.3)
Hemoglobin: 11.5 g/dL — ABNORMAL LOW (ref 11.7–15.4)
Immature Granulocytes: 0 % (ref 0.0–5.0)
Lymphocytes %: 26 % (ref 13–44)
Lymphocytes Absolute: 2.2 10*3/uL (ref 0.5–4.6)
MCH: 22.3 PG — ABNORMAL LOW (ref 26.1–32.9)
MCHC: 31.5 g/dL (ref 31.4–35.0)
MCV: 70.7 FL — ABNORMAL LOW (ref 79.6–97.8)
MPV: UNDETERMINED FL (ref 9.4–12.3)
Monocytes %: 7 % (ref 4.0–12.0)
Monocytes Absolute: 0.6 10*3/uL (ref 0.1–1.3)
NRBC Absolute: 0 10*3/uL (ref 0.0–0.2)
Neutrophils %: 62 % (ref 43–78)
Neutrophils Absolute: 5.1 10*3/uL (ref 1.7–8.2)
Platelet Comment: ADEQUATE
Platelets: 177 10*3/uL (ref 150–450)
RBC: 5.16 M/uL (ref 4.05–5.2)
RDW: 15.3 % — ABNORMAL HIGH (ref 11.9–14.6)
WBC: 8.3 10*3/uL (ref 4.3–11.1)

## 2019-03-08 LAB — BLOOD GAS, ARTERIAL POC
BASE DEFICIT (POC): 1 mmol/L
Base deficit (POC): 1 mmol/L
CO2, POC: 24 MMOL/L
COLLECT TIME: 2000
COLLECT TIME: 2000
HCO3 (POC): 23.3 MMOL/L (ref 22–26)
HCO3, Art: 23.3 MMOL/L (ref 22–26)
POC O2 SAT: 98 % (ref 95–98)
TCO2, POC: 24 MMOL/L
pCO2 (POC): 35.9 MMHG (ref 35–45)
pCO2, Art: 35.9 MMHG (ref 35–45)
pH (POC): 7.42 (ref 7.35–7.45)
pH, Art: 7.42 (ref 7.35–7.45)
pO2 (POC): 99 MMHG (ref 75–100)
pO2, Art: 99 MMHG (ref 75–100)
sO2 (POC): 98 % (ref 95–98)

## 2019-03-08 LAB — CBC WITH AUTOMATED DIFF
ABS. BASOPHILS: 0.1 10*3/uL (ref 0.0–0.2)
ABS. EOSINOPHILS: 0.3 10*3/uL (ref 0.0–0.8)
ABS. IMM. GRANS.: 0 10*3/uL (ref 0.0–0.5)
ABS. LYMPHOCYTES: 2.2 10*3/uL (ref 0.5–4.6)
ABS. MONOCYTES: 0.6 10*3/uL (ref 0.1–1.3)
ABS. NEUTROPHILS: 5.1 10*3/uL (ref 1.7–8.2)
ABSOLUTE NRBC: 0 10*3/uL (ref 0.0–0.2)
BASOPHILS: 1 % (ref 0.0–2.0)
EOSINOPHILS: 4 % (ref 0.5–7.8)
HCT: 36.5 % (ref 35.8–46.3)
HGB: 11.5 g/dL — ABNORMAL LOW (ref 11.7–15.4)
IMMATURE GRANULOCYTES: 0 % (ref 0.0–5.0)
LYMPHOCYTES: 26 % (ref 13–44)
MCH: 22.3 PG — ABNORMAL LOW (ref 26.1–32.9)
MCHC: 31.5 g/dL (ref 31.4–35.0)
MCV: 70.7 FL — ABNORMAL LOW (ref 79.6–97.8)
MONOCYTES: 7 % (ref 4.0–12.0)
MPV: UNDETERMINED FL (ref 9.4–12.3)
NEUTROPHILS: 62 % (ref 43–78)
PLATELET COMMENTS: ADEQUATE
PLATELET: 177 10*3/uL (ref 150–450)
RBC: 5.16 M/uL (ref 4.05–5.2)
RDW: 15.3 % — ABNORMAL HIGH (ref 11.9–14.6)
WBC: 8.3 10*3/uL (ref 4.3–11.1)

## 2019-03-08 LAB — EKG, 12 LEAD, INITIAL
Atrial Rate: 69 {beats}/min
Calculated P Axis: 67 degrees
Calculated R Axis: -9 degrees
Calculated T Axis: 64 degrees
Diagnosis: NORMAL
P-R Interval: 166 ms
Q-T Interval: 394 ms
QRS Duration: 84 ms
QTC Calculation (Bezet): 422 ms
Ventricular Rate: 69 {beats}/min

## 2019-03-09 NOTE — Progress Notes (Signed)
COVID-19 ED/Flu Clinic Initial Outreach Note    The patient was contacted regarding recent visit for viral symptoms.  This author contacted the patietn by telephone to perform post discharge call. Verified name and DOB with patient as identifiers. Provided introduction to self, and reason for call due to viral symptoms of infection and/or possible exposure to COVID-19.        Patient presented to emergency department/flu clinic with Chest pain with shortness of breath  Patient reported symptoms are improved  Due to no new or worsening sypmtoms the RN CTN/ACM was not notified for escalation.   Discussed exposure protocols and quarantine with CDC Guidelines What To Do If You Are Sick   The patient was given an opportunity for questions and concerns.     Stay home except to get medical care  People who are mildly ill with COVID-19 are able to isolate at home during their illness. You should restrict activities outside your home, except for getting medical care. Do not go to work, school, or public areas. Avoid using public transportation, ride-sharing, or taxis.  Separate yourself from other people and animals in your home  People: As much as possible, you should stay in a specific room and away from other people in your home. Also, you should use a separate bathroom, if available.  Animals: You should restrict contact with pets and other animals while you are sick with COVID-19, just like you would around other people. Although there have not been reports of pets or other animals becoming sick with COVID-19, it is still recommended that people sick with COVID-19 limit contact with animals until more information is known about the virus. When possible, have another member of your household care for your animals while you are sick. If you are sick with COVID-19, avoid contact with your pet, including petting, snuggling, being kissed or licked, and sharing  food. If you must care for your pet or be around animals while you are sick, wash your hands before and after you interact with pets and wear a facemask.  Call ahead before visiting your doctor  If you have a medical appointment, call the healthcare provider and tell them that you have or may have COVID-19. This will help the healthcare provider's office take steps to keep other people from getting infected or exposed.  Wear a facemask  You should wear a facemask when you are around other people (e.g., sharing a room or vehicle) or pets and before you enter a healthcare provider's office. If you are not able to wear a facemask (for example, because it causes trouble breathing), then people who live with you should not stay in the same room with you, or they should wear a facemask if they enter your room.  Cover your coughs and sneezes  Cover your mouth and nose with a tissue when you cough or sneeze. Throw used tissues in a lined trash can. Immediately wash your hands with soap and water for at least 20 seconds or, if soap and water are not available, clean your hands with an alcohol-based hand sanitizer that contains at least 60% alcohol.    Clean your hands often  Wash your hands often with soap and water for at least 20 seconds, especially after blowing your nose, coughing, or sneezing; going to the bathroom; and before eating or preparing food. If soap and water are not readily available, use an alcohol-based hand sanitizer with at least 60% alcohol, covering all surfaces of your hands  and rubbing them together until they feel dry.  Soap and water are the best option if hands are visibly dirty. Avoid touching your eyes, nose, and mouth with unwashed hands.  Avoid sharing personal household items  You should not share dishes, drinking glasses, cups, eating utensils, towels, or bedding with other people or pets in your home. After using these items, they should be washed thoroughly with soap and water.   Clean all ???high-touch??? surfaces everyday  High touch surfaces include counters, tabletops, doorknobs, bathroom fixtures, toilets, phones, keyboards, tablets, and bedside tables. Also, clean any surfaces that may have blood, stool, or body fluids on them. Use a household cleaning spray or wipe, according to the label instructions. Labels contain instructions for safe and effective use of the cleaning product including precautions you should take when applying the product, such as wearing gloves and making sure you have good ventilation during use of the product.  Monitor your symptoms  Seek prompt medical attention if your illness is worsening (e.g., difficulty breathing). Before seeking care, call your healthcare provider and tell them that you have, or are being evaluated for, COVID-19. Put on a facemask before you enter the facility. These steps will help the healthcare provider's office to keep other people in the office or waiting room from getting infected or exposed. Ask your healthcare provider to call the local or state health department. Persons who are placed under If you have a medical emergency and need to call 911, notify the dispatch personnel that you have, or are being evaluated for COVID-19. If possible, put on a facemask before emergency medical services arrive.    The patient agrees to contact the [employer] exposure line [phone], local health department [place] [facility] [phone]) and PCP office for questions related to their healthcare. Chief Strategy Officer provided contact information for future reference. Patient driving and will search Internet for numbers.    Patient/family/caregiver given information for [employer] and agrees to enroll: No

## 2019-03-09 NOTE — Progress Notes (Signed)
COVID-19 ED/Flu Clinic Initial Outreach Note    The patient was contacted regarding recent visit for viral symptoms.  This author contacted the patietn by telephone to perform post discharge call. Verified name and DOB with patient as identifiers. Provided introduction to self, and reason for call due to viral symptoms of infection and/or possible exposure to COVID-19.        Patient presented to emergency department/flu clinic with Chest pain with shortness of breath  Patient reported symptoms are improved  Due to no new or worsening sypmtoms the RN CTN/ACM was not notified for escalation.   Discussed exposure protocols and quarantine with CDC Guidelines What To Do If You Are Sick   The patient was given an opportunity for questions and concerns.     Stay home except to get medical care  People who are mildly ill with COVID-19 are able to isolate at home during their illness. You should restrict activities outside your home, except for getting medical care. Do not go to work, school, or public areas. Avoid using public transportation, ride-sharing, or taxis.  Separate yourself from other people and animals in your home  People: As much as possible, you should stay in a specific room and away from other people in your home. Also, you should use a separate bathroom, if available.  Animals: You should restrict contact with pets and other animals while you are sick with COVID-19, just like you would around other people. Although there have not been reports of pets or other animals becoming sick with COVID-19, it is still recommended that people sick with COVID-19 limit contact with animals until more information is known about the virus. When possible, have another member of your household care for your animals while you are sick. If you are sick with COVID-19, avoid contact with your pet, including petting, snuggling, being kissed or licked, and sharing food. If you must care for your pet or be around animals while  you are sick, wash your hands before and after you interact with pets and wear a facemask.  Call ahead before visiting your doctor  If you have a medical appointment, call the healthcare provider and tell them that you have or may have COVID-19. This will help the healthcare provider's office take steps to keep other people from getting infected or exposed.  Wear a facemask  You should wear a facemask when you are around other people (e.g., sharing a room or vehicle) or pets and before you enter a healthcare provider's office. If you are not able to wear a facemask (for example, because it causes trouble breathing), then people who live with you should not stay in the same room with you, or they should wear a facemask if they enter your room.  Cover your coughs and sneezes  Cover your mouth and nose with a tissue when you cough or sneeze. Throw used tissues in a lined trash can. Immediately wash your hands with soap and water for at least 20 seconds or, if soap and water are not available, clean your hands with an alcohol-based hand sanitizer that contains at least 60% alcohol.    Clean your hands often  Wash your hands often with soap and water for at least 20 seconds, especially after blowing your nose, coughing, or sneezing; going to the bathroom; and before eating or preparing food. If soap and water are not readily available, use an alcohol-based hand sanitizer with at least 60% alcohol, covering all surfaces of your hands  and rubbing them together until they feel dry.  Soap and water are the best option if hands are visibly dirty. Avoid touching your eyes, nose, and mouth with unwashed hands.  Avoid sharing personal household items  You should not share dishes, drinking glasses, cups, eating utensils, towels, or bedding with other people or pets in your home. After using these items, they should be washed thoroughly with soap and water.  Clean all "high-touch" surfaces everyday  High touch surfaces include  counters, tabletops, doorknobs, bathroom fixtures, toilets, phones, keyboards, tablets, and bedside tables. Also, clean any surfaces that may have blood, stool, or body fluids on them. Use a household cleaning spray or wipe, according to the label instructions. Labels contain instructions for safe and effective use of the cleaning product including precautions you should take when applying the product, such as wearing gloves and making sure you have good ventilation during use of the product.  Monitor your symptoms  Seek prompt medical attention if your illness is worsening (e.g., difficulty breathing). Before seeking care, call your healthcare provider and tell them that you have, or are being evaluated for, COVID-19. Put on a facemask before you enter the facility. These steps will help the healthcare provider's office to keep other people in the office or waiting room from getting infected or exposed. Ask your healthcare provider to call the local or state health department. Persons who are placed under If you have a medical emergency and need to call 911, notify the dispatch personnel that you have, or are being evaluated for COVID-19. If possible, put on a facemask before emergency medical services arrive.    The patient agrees to contact the [facility] exposure line [phone], local health department [facility] [phone]) and PCP office for questions related to their healthcare. Chartered loss adjuster provided contact information for future reference. Patient driving and will search Internet for numbers.    Patient/family/caregiver given information for [employer] and agrees to enroll: No

## 2019-03-12 ENCOUNTER — Encounter: Payer: Self-pay | Admitting: Family Medicine

## 2019-03-13 ENCOUNTER — Other Ambulatory Visit: Payer: Self-pay | Admitting: Family Medicine

## 2019-03-13 DIAGNOSIS — F419 Anxiety disorder, unspecified: Secondary | ICD-10-CM

## 2019-03-13 MED ORDER — LORAZEPAM 0.5 MG PO TABS: 0.5000 mg | ORAL_TABLET | Freq: Three times a day (TID) | ORAL | 0 refills | Status: AC | PRN

## 2019-03-16 NOTE — Assessment & Plan Note (Deleted)
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  Current treatment: Oral iron therapy. She follows with her GYN for her heavy uterine bleeding.  Lab review:

## 2019-03-17 ENCOUNTER — Telehealth: Payer: Self-pay | Admitting: Internal Medicine

## 2019-03-17 NOTE — Telephone Encounter (Signed)
Error

## 2019-03-21 ENCOUNTER — Inpatient Hospital Stay: Payer: Self-pay

## 2019-03-22 ENCOUNTER — Encounter: Payer: Self-pay | Admitting: Family Medicine

## 2019-03-23 ENCOUNTER — Inpatient Hospital Stay: Payer: Self-pay | Attending: Internal Medicine | Admitting: Hematology and Oncology

## 2019-04-26 ENCOUNTER — Encounter: Attending: Obstetrics & Gynecology | Primary: Family

## 2019-05-04 ENCOUNTER — Other Ambulatory Visit: Payer: Self-pay | Admitting: Family Medicine

## 2019-05-07 ENCOUNTER — Inpatient Hospital Stay
Admit: 2019-05-07 | Discharge: 2019-05-07 | Disposition: A | Discharge status: Home / Self Care | Attending: Emergency Medicine

## 2019-05-07 ENCOUNTER — Emergency Department: Admit: 2019-05-07 | Primary: Family

## 2019-05-07 ENCOUNTER — Emergency Department: Primary: Family

## 2019-05-07 DIAGNOSIS — D259 Leiomyoma of uterus, unspecified: Secondary | ICD-10-CM

## 2019-05-07 LAB — CBC WITH AUTO DIFFERENTIAL
Basophils %: 1 % (ref 0.0–2.0)
Basophils Absolute: 0.1 10*3/uL (ref 0.0–0.2)
Eosinophils %: 4 % (ref 0.5–7.8)
Eosinophils Absolute: 0.3 10*3/uL (ref 0.0–0.8)
Granulocyte Absolute Count: 0 10*3/uL (ref 0.0–0.5)
Hematocrit: 37.6 % (ref 35.8–46.3)
Hemoglobin: 11.9 g/dL (ref 11.7–15.4)
Immature Granulocytes: 0 % (ref 0.0–5.0)
Lymphocytes %: 33 % (ref 13–44)
Lymphocytes Absolute: 2.6 10*3/uL (ref 0.5–4.6)
MCH: 22.2 PG — ABNORMAL LOW (ref 26.1–32.9)
MCHC: 31.6 g/dL (ref 31.4–35.0)
MCV: 70 FL — ABNORMAL LOW (ref 79.6–97.8)
MPV: UNDETERMINED FL (ref 9.4–12.3)
Monocytes %: 6 % (ref 4.0–12.0)
Monocytes Absolute: 0.5 10*3/uL (ref 0.1–1.3)
NRBC Absolute: 0 10*3/uL (ref 0.0–0.2)
Neutrophils %: 57 % (ref 43–78)
Neutrophils Absolute: 4.6 10*3/uL (ref 1.7–8.2)
Platelets: 185 10*3/uL (ref 150–450)
RBC: 5.37 M/uL — ABNORMAL HIGH (ref 4.05–5.2)
RDW: 15.5 % — ABNORMAL HIGH (ref 11.9–14.6)
WBC: 8.1 10*3/uL (ref 4.3–11.1)

## 2019-05-07 LAB — COMPREHENSIVE METABOLIC PANEL
ALT: 20 U/L (ref 12–65)
AST: 10 U/L — ABNORMAL LOW (ref 15–37)
Albumin/Globulin Ratio: 1.1 — ABNORMAL LOW (ref 1.2–3.5)
Albumin: 3.9 g/dL (ref 3.5–5.0)
Alkaline Phosphatase: 63 U/L (ref 50–136)
Anion Gap: 8 mmol/L (ref 7–16)
BUN: 5 MG/DL — ABNORMAL LOW (ref 6–23)
CO2: 24 mmol/L (ref 21–32)
Calcium: 8.8 MG/DL (ref 8.3–10.4)
Chloride: 100 mmol/L (ref 98–107)
Creatinine: 0.75 MG/DL (ref 0.6–1.0)
EGFR IF NonAfrican American: >60 mL/min/{1.73_m2} (ref >60)
GFR African American: >60 mL/min/{1.73_m2} (ref >60)
Globulin: 3.6 g/dL — ABNORMAL HIGH (ref 2.3–3.5)
Glucose: 84 mg/dL (ref 65–100)
Potassium: 3.7 mmol/L (ref 3.5–5.1)
Sodium: 132 mmol/L — ABNORMAL LOW (ref 136–145)
Total Bilirubin: 0.3 MG/DL (ref 0.2–1.1)
Total Protein: 7.5 g/dL (ref 6.3–8.2)

## 2019-05-07 LAB — URINE MICROSCOPIC
BACTERIA, URINE: 0 /hpf
Bacteria: 0 /hpf

## 2019-05-07 LAB — PHOSPHORUS
Phosphorus: 3 MG/DL (ref 2.5–4.5)
Phosphorus: 3 MG/DL (ref 2.5–4.5)

## 2019-05-07 LAB — D-DIMER, QUANTITATIVE: D-Dimer, Quant: 0.34 ug/ml(FEU) (ref <0.56)

## 2019-05-07 LAB — HCG URINE, QL. - POC
HCG, Pregnancy, Urine, POC: NEGATIVE
Pregnancy test,urine (POC): NEGATIVE

## 2019-05-07 LAB — LIPASE
Lipase: 82 U/L (ref 73–393)
Lipase: 82 U/L (ref 73–393)

## 2019-05-07 LAB — MAGNESIUM
Magnesium: 1.8 mg/dL (ref 1.8–2.4)
Magnesium: 1.8 mg/dL (ref 1.8–2.4)

## 2019-05-07 LAB — CBC WITH AUTOMATED DIFF
ABS. BASOPHILS: 0.1 10*3/uL (ref 0.0–0.2)
ABS. EOSINOPHILS: 0.3 10*3/uL (ref 0.0–0.8)
ABS. IMM. GRANS.: 0 10*3/uL (ref 0.0–0.5)
ABS. LYMPHOCYTES: 2.6 10*3/uL (ref 0.5–4.6)
ABS. MONOCYTES: 0.5 10*3/uL (ref 0.1–1.3)
ABS. NEUTROPHILS: 4.6 10*3/uL (ref 1.7–8.2)
ABSOLUTE NRBC: 0 10*3/uL (ref 0.0–0.2)
BASOPHILS: 1 % (ref 0.0–2.0)
EOSINOPHILS: 4 % (ref 0.5–7.8)
HCT: 37.6 % (ref 35.8–46.3)
HGB: 11.9 g/dL (ref 11.7–15.4)
IMMATURE GRANULOCYTES: 0 % (ref 0.0–5.0)
LYMPHOCYTES: 33 % (ref 13–44)
MCH: 22.2 PG — ABNORMAL LOW (ref 26.1–32.9)
MCHC: 31.6 g/dL (ref 31.4–35.0)
MCV: 70 FL — ABNORMAL LOW (ref 79.6–97.8)
MONOCYTES: 6 % (ref 4.0–12.0)
MPV: UNDETERMINED FL (ref 9.4–12.3)
NEUTROPHILS: 57 % (ref 43–78)
PLATELET: 185 10*3/uL (ref 150–450)
RBC: 5.37 M/uL — ABNORMAL HIGH (ref 4.05–5.2)
RDW: 15.5 % — ABNORMAL HIGH (ref 11.9–14.6)
WBC: 8.1 10*3/uL (ref 4.3–11.1)

## 2019-05-07 LAB — METABOLIC PANEL, COMPREHENSIVE
A-G Ratio: 1.1 — ABNORMAL LOW (ref 1.2–3.5)
ALT (SGPT): 20 U/L (ref 12–65)
AST (SGOT): 10 U/L — ABNORMAL LOW (ref 15–37)
Albumin: 3.9 g/dL (ref 3.5–5.0)
Alk. phosphatase: 63 U/L (ref 50–136)
Anion gap: 8 mmol/L (ref 7–16)
BUN: 5 MG/DL — ABNORMAL LOW (ref 6–23)
Bilirubin, total: 0.3 MG/DL (ref 0.2–1.1)
CO2: 24 mmol/L (ref 21–32)
Calcium: 8.8 MG/DL (ref 8.3–10.4)
Chloride: 100 mmol/L (ref 98–107)
Creatinine: 0.75 MG/DL (ref 0.6–1.0)
GFR est AA: >60 mL/min/{1.73_m2} (ref >60)
GFR est non-AA: >60 mL/min/{1.73_m2} (ref >60)
Globulin: 3.6 g/dL — ABNORMAL HIGH (ref 2.3–3.5)
Glucose: 84 mg/dL (ref 65–100)
Potassium: 3.7 mmol/L (ref 3.5–5.1)
Protein, total: 7.5 g/dL (ref 6.3–8.2)
Sodium: 132 mmol/L — ABNORMAL LOW (ref 136–145)

## 2019-05-07 LAB — D DIMER: D DIMER: 0.34 ug/ml(FEU) (ref <0.56)

## 2019-05-07 MED ORDER — MORPHINE 4 MG/ML INTRAVENOUS SOLUTION
4 mg/mL | INTRAVENOUS | Status: DC
Start: 2019-05-07 — End: 2019-05-07

## 2019-05-07 MED ORDER — SODIUM CHLORIDE 0.9% BOLUS IV
0.9 % | Freq: Once | INTRAVENOUS | Status: AC
Start: 2019-05-07 — End: 2019-05-07
  Administered 2019-05-07: 20:00:00 via INTRAVENOUS

## 2019-05-07 MED ORDER — LORAZEPAM 2 MG/ML IJ SOLN
2 mg/mL | INTRAMUSCULAR | Status: DC
Start: 2019-05-07 — End: 2019-05-07

## 2019-05-07 MED ORDER — ONDANSETRON (PF) 4 MG/2 ML INJECTION
4 mg/2 mL | INTRAMUSCULAR | Status: DC
Start: 2019-05-07 — End: 2019-05-07

## 2019-05-07 MED FILL — LORAZEPAM 2 MG/ML IJ SOLN: 2 mg/mL | INTRAMUSCULAR | Qty: 1

## 2019-05-07 MED FILL — ONDANSETRON (PF) 4 MG/2 ML INJECTION: 4 mg/2 mL | INTRAMUSCULAR | Qty: 2

## 2019-05-07 MED FILL — MORPHINE 4 MG/ML INTRAVENOUS SOLUTION: 4 mg/mL | INTRAVENOUS | Qty: 1

## 2019-05-07 NOTE — ED Triage Notes (Signed)
Patient co abdominal pain and constipation for 3 days.  Patient states she is off all her medications

## 2019-05-07 NOTE — Progress Notes (Signed)
Unable to view poc urine

## 2019-05-07 NOTE — ED Provider Notes (Addendum)
38 year old female presents with complaints of diffuse abdominal pain radiating into her chest with constipation  Patient reports attempting magnesium and senna with no relief of the constipation  Apparently patient has stopped all of her other prescription medications    She denies any fevers or chills    The history is provided by the patient.   Abdominal Pain    This is a recurrent problem. The current episode started more than 2 days ago. The problem occurs constantly. The problem has been gradually worsening. The pain is associated with an unknown factor. The pain is located in the generalized abdominal region. The quality of the pain is aching and pressure-like. The pain is moderate. Associated symptoms include nausea and constipation. Pertinent negatives include no fever, no diarrhea, no vomiting, no dysuria, no headaches, no arthralgias, no myalgias, no chest pain and no back pain. The pain is worsened by eating and activity. The pain is relieved by nothing. Her past medical history is significant for UTI and ovarian cysts. The patient's surgical history non-contributory.       Past Medical History:   Diagnosis Date   ??? Autosomal dominant congenital non-nuclear cataract    ??? 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 Vitiligo        Past Surgical History:   Procedure Laterality Date   ??? HX GYN      D&C   ??? HX WISDOM TEETH EXTRACTION      x1 left bottom         Family History:   Problem Relation Age of Onset   ??? Celiac Disease Mother    ??? Diabetes Mother    ??? Hypertension Mother    ??? Hypertension Father    ??? No Known Problems Sister    ??? Asthma Brother    ??? Hypertension Maternal Grandmother    ??? Diabetes Maternal Grandmother    ??? No Known Problems Maternal Grandfather    ??? Breast Cancer Paternal Grandmother    ??? Diabetes Paternal Grandmother    ??? Hypertension Paternal Grandmother    ??? Heart Attack Paternal Grandfather        Social History     Socioeconomic History   ??? Marital status: MARRIED      Spouse name: Not on file   ??? Number of children: Not on file   ??? Years of education: Not on file   ??? Highest education level: Not on file   Occupational History   ??? Not on file   Social Needs   ??? Financial resource strain: Not on file   ??? Food insecurity     Worry: Not on file     Inability: Not on file   ??? Transportation needs     Medical: Not on file     Non-medical: Not on file   Tobacco Use   ??? Smoking status: Former Smoker     Last attempt to quit: 05/27/2015     Years since quitting: 3.9   ??? Smokeless tobacco: Never Used   Substance and Sexual Activity   ??? Alcohol use: Yes     Alcohol/week: 7.0 standard drinks     Types: 3 Glasses of wine, 4 Shots of liquor per week   ??? Drug use: No   ??? Sexual activity: Yes     Partners: Male     Birth control/protection: None   Lifestyle   ??? Physical activity     Days per week: Not on file  Minutes per session: Not on file   ??? Stress: Not on file   Relationships   ??? Social Product manager on phone: Not on file     Gets together: Not on file     Attends religious service: Not on file     Active member of club or organization: Not on file     Attends meetings of clubs or organizations: Not on file     Relationship status: Not on file   ??? Intimate partner violence     Fear of current or ex partner: Not on file     Emotionally abused: Not on file     Physically abused: Not on file     Forced sexual activity: Not on file   Other Topics Concern   ??? Not on file   Social History Narrative   ??? Not on file         ALLERGIES: Macrobid [nitrofurantoin monohyd/m-cryst]    Review of Systems   Constitutional: Negative for chills and fever.   HENT: Negative for congestion, ear pain and rhinorrhea.    Eyes: Negative for photophobia and discharge.   Respiratory: Negative for cough and shortness of breath.    Cardiovascular: Negative for chest pain and palpitations.   Gastrointestinal: Positive for abdominal pain, constipation and nausea. Negative for diarrhea and vomiting.    Endocrine: Negative for cold intolerance and heat intolerance.   Genitourinary: Negative for dysuria and flank pain.   Musculoskeletal: Negative for arthralgias, back pain, myalgias and neck pain.   Skin: Negative for rash and wound.   Allergic/Immunologic: Negative for environmental allergies and food allergies.   Neurological: Negative for syncope and headaches.   Hematological: Negative for adenopathy. Does not bruise/bleed easily.   Psychiatric/Behavioral: Positive for dysphoric mood. The patient is nervous/anxious.    All other systems reviewed and are negative.      Vitals:    05/07/19 1531   BP: 103/67   Pulse: 86   Resp: 16   Temp: 98.5 ??F (36.9 ??C)   SpO2: 99%   Weight: 70 kg (154 lb 5.2 oz)   Height: 5\' 6"  (1.676 m)            Physical Exam  Vitals signs and nursing note reviewed.   Constitutional:       General: She is in acute distress.      Appearance: Normal appearance. She is well-developed and normal weight.   HENT:      Head: Normocephalic and atraumatic.      Right Ear: External ear normal.      Left Ear: External ear normal.      Mouth/Throat:      Mouth: Mucous membranes are moist.      Pharynx: Oropharynx is clear.   Eyes:      Extraocular Movements: Extraocular movements intact.      Conjunctiva/sclera: Conjunctivae normal.      Pupils: Pupils are equal, round, and reactive to light.   Neck:      Musculoskeletal: Normal range of motion and neck supple.      Vascular: No JVD.   Cardiovascular:      Rate and Rhythm: Normal rate and regular rhythm.      Heart sounds: Normal heart sounds. No murmur. No friction rub. No gallop.    Pulmonary:      Effort: Pulmonary effort is normal.      Breath sounds: Normal breath sounds.   Abdominal:  General: Bowel sounds are normal. There is no distension.      Palpations: Abdomen is soft. There is no mass.      Tenderness: There is generalized abdominal tenderness and tenderness in the suprapubic area. There is no right CVA tenderness, guarding or  rebound.      Hernia: No hernia is present.   Musculoskeletal: Normal range of motion.         General: No deformity.   Skin:     General: Skin is warm and dry.      Capillary Refill: Capillary refill takes less than 2 seconds.      Findings: No rash.   Neurological:      General: No focal deficit present.      Mental Status: She is alert and oriented to person, place, and time.      Cranial Nerves: No cranial nerve deficit.      Sensory: No sensory deficit.      Gait: Gait normal.   Psychiatric:         Mood and Affect: Mood is anxious.         Speech: Speech normal.         Behavior: Behavior normal.         Thought Content: Thought content normal.         Judgment: Judgment normal.          MDM  Number of Diagnoses or Management Options  Uterine leiomyoma, unspecified location: established and worsening  Diagnosis management comments: EKG reviewed  Sinus rhythm normal intervals  Normal axis, no ectopy no acute ischemic changes      4:45 PM  Patient refused all medications except IV fluids.  Also refusing contrast for her CAT scan    5:43 PM  Lab work unremarkable  Urine negative    Reviewed CT scan  Patient has markedly enlarged fibroids and enlarged uterus.  There is a mild to moderate degree of bladder distention as well  GI tract does reveal a prominent amount of stool but no evidence of ileus or obstruction.    At this point, it looks like most of the patient's symptoms are likely coming from the large uterine fibroids.      6:01 PM  Reviewed findings with patient  Answered questions to the best of my ability  Reaffirm need for GYN follow-up to discuss hysterectomy and removal of exophytic fibroid  Patient voiced understanding of instructions       Amount and/or Complexity of Data Reviewed  Clinical lab tests: ordered and reviewed  Tests in the radiology section of CPT??: ordered and reviewed  Tests in the medicine section of CPT??: reviewed and ordered   Decide to obtain previous medical records or to obtain history from someone other than the patient: yes  Review and summarize past medical records: yes  Independent visualization of images, tracings, or specimens: yes    Risk of Complications, Morbidity, and/or Mortality  Presenting problems: moderate  Diagnostic procedures: moderate  Management options: moderate  General comments: Elements of this note have been dictated via voice recognition software.  Text and phrases may be limited by the accuracy of the software.  The chart has been reviewed, but errors may still be present.      Patient Progress  Patient progress: stable         Procedures

## 2019-05-07 NOTE — ED Notes (Signed)
I have reviewed discharge instructions with the patient.  The patient verbalized understanding.    Patient left ED via Discharge Method: wheelchair to Home with spouse    Opportunity for questions and clarification provided.       Patient given 0 scripts.         To continue your aftercare when you leave the hospital, you may receive an automated call from our care team to check in on how you are doing.  This is a free service and part of our promise to provide the best care and service to meet your aftercare needs.??? If you have questions, or wish to unsubscribe from this service please call [phone].  Thank you for Choosing our [facility].

## 2019-05-07 NOTE — ED Notes (Signed)
I have reviewed discharge instructions with the patient.  The patient verbalized understanding.    Patient left ED via Discharge Method: wheelchair to Home with spouse.    Opportunity for questions and clarification provided.       Patient given 0 scripts.         To continue your aftercare when you leave the hospital, you may receive an automated call from our care team to check in on how you are doing.  This is a free service and part of our promise to provide the best care and service to meet your aftercare needs." If you have questions, or wish to unsubscribe from this service please call [phone].  Thank you for Choosing our [facility].

## 2019-05-07 NOTE — ED Notes (Signed)
Patient co abdominal pain and constipation for 3 days.  Patient states she is off all her medications

## 2019-05-07 NOTE — ED Provider Notes (Signed)
38 year old female presents with complaints of diffuse abdominal pain radiating into her chest with constipation  Patient reports attempting magnesium and senna with no relief of the constipation  Apparently patient has stopped all of her other prescription medications    She denies any fevers or chills    The history is provided by the patient.   Abdominal Pain    This is a recurrent problem. The current episode started more than 2 days ago. The problem occurs constantly. The problem has been gradually worsening. The pain is associated with an unknown factor. The pain is located in the generalized abdominal region. The quality of the pain is aching and pressure-like. The pain is moderate. Associated symptoms include nausea and constipation. Pertinent negatives include no fever, no diarrhea, no vomiting, no dysuria, no headaches, no arthralgias, no myalgias, no chest pain and no back pain. The pain is worsened by eating and activity. The pain is relieved by nothing. Her past medical history is significant for UTI and ovarian cysts. The patient's surgical history non-contributory.       Past Medical History:   Diagnosis Date   ??? Autosomal dominant congenital non-nuclear cataract    ??? 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 Vitiligo        Past Surgical History:   Procedure Laterality Date   ??? HX GYN      D&C   ??? HX WISDOM TEETH EXTRACTION      x1 left bottom         Family History:   Problem Relation Age of Onset   ??? Celiac Disease Mother    ??? Diabetes Mother    ??? Hypertension Mother    ??? Hypertension Father    ??? No Known Problems Sister    ??? Asthma Brother    ??? Hypertension Maternal Grandmother    ??? Diabetes Maternal Grandmother    ??? No Known Problems Maternal Grandfather    ??? Breast Cancer Paternal Grandmother    ??? Diabetes Paternal Grandmother    ??? Hypertension Paternal Grandmother    ??? Heart Attack Paternal Grandfather        Social History     Socioeconomic History   ??? Marital status: MARRIED      Spouse name: Not on file   ??? Number of children: Not on file   ??? Years of education: Not on file   ??? Highest education level: Not on file   Occupational History   ??? Not on file   Social Needs   ??? Financial resource strain: Not on file   ??? Food insecurity     Worry: Not on file     Inability: Not on file   ??? Transportation needs     Medical: Not on file     Non-medical: Not on file   Tobacco Use   ??? Smoking status: Former Smoker     Last attempt to quit: 05/27/2015     Years since quitting: 3.9   ??? Smokeless tobacco: Never Used   Substance and Sexual Activity   ??? Alcohol use: Yes     Alcohol/week: 7.0 standard drinks     Types: 3 Glasses of wine, 4 Shots of liquor per week   ??? Drug use: No   ??? Sexual activity: Yes     Partners: Male     Birth control/protection: None   Lifestyle   ??? Physical activity     Days per week: Not on file  Minutes per session: Not on file   ??? Stress: Not on file   Relationships   ??? Social Product manager on phone: Not on file     Gets together: Not on file     Attends religious service: Not on file     Active member of club or organization: Not on file     Attends meetings of clubs or organizations: Not on file     Relationship status: Not on file   ??? Intimate partner violence     Fear of current or ex partner: Not on file     Emotionally abused: Not on file     Physically abused: Not on file     Forced sexual activity: Not on file   Other Topics Concern   ??? Not on file   Social History Narrative   ??? Not on file         ALLERGIES: Macrobid [nitrofurantoin monohyd/m-cryst]    Review of Systems   Constitutional: Negative for chills and fever.   HENT: Negative for congestion, ear pain and rhinorrhea.    Eyes: Negative for photophobia and discharge.   Respiratory: Negative for cough and shortness of breath.    Cardiovascular: Negative for chest pain and palpitations.   Gastrointestinal: Positive for abdominal pain, constipation and nausea. Negative for diarrhea and vomiting.   Endocrine:  Negative for cold intolerance and heat intolerance.   Genitourinary: Negative for dysuria and flank pain.   Musculoskeletal: Negative for arthralgias, back pain, myalgias and neck pain.   Skin: Negative for rash and wound.   Allergic/Immunologic: Negative for environmental allergies and food allergies.   Neurological: Negative for syncope and headaches.   Hematological: Negative for adenopathy. Does not bruise/bleed easily.   Psychiatric/Behavioral: Positive for dysphoric mood. The patient is nervous/anxious.    All other systems reviewed and are negative.      Vitals:    05/07/19 1531   BP: 103/67   Pulse: 86   Resp: 16   Temp: 98.5 ??F (36.9 ??C)   SpO2: 99%   Weight: 70 kg (154 lb 5.2 oz)   Height: 5\' 6"  (1.676 m)            Physical Exam  Vitals signs and nursing note reviewed.   Constitutional:       General: She is in acute distress.      Appearance: Normal appearance. She is well-developed and normal weight.   HENT:      Head: Normocephalic and atraumatic.      Right Ear: External ear normal.      Left Ear: External ear normal.      Mouth/Throat:      Mouth: Mucous membranes are moist.      Pharynx: Oropharynx is clear.   Eyes:      Extraocular Movements: Extraocular movements intact.      Conjunctiva/sclera: Conjunctivae normal.      Pupils: Pupils are equal, round, and reactive to light.   Neck:      Musculoskeletal: Normal range of motion and neck supple.      Vascular: No JVD.   Cardiovascular:      Rate and Rhythm: Normal rate and regular rhythm.      Heart sounds: Normal heart sounds. No murmur. No friction rub. No gallop.    Pulmonary:      Effort: Pulmonary effort is normal.      Breath sounds: Normal breath sounds.   Abdominal:  General: Bowel sounds are normal. There is no distension.      Palpations: Abdomen is soft. There is no mass.      Tenderness: There is generalized abdominal tenderness and tenderness in the suprapubic area. There is no right CVA tenderness, guarding or rebound.       Hernia: No hernia is present.   Musculoskeletal: Normal range of motion.         General: No deformity.   Skin:     General: Skin is warm and dry.      Capillary Refill: Capillary refill takes less than 2 seconds.      Findings: No rash.   Neurological:      General: No focal deficit present.      Mental Status: She is alert and oriented to person, place, and time.      Cranial Nerves: No cranial nerve deficit.      Sensory: No sensory deficit.      Gait: Gait normal.   Psychiatric:         Mood and Affect: Mood is anxious.         Speech: Speech normal.         Behavior: Behavior normal.         Thought Content: Thought content normal.         Judgment: Judgment normal.          MDM  Number of Diagnoses or Management Options  Uterine leiomyoma, unspecified location: established and worsening  Diagnosis management comments: EKG reviewed  Sinus rhythm normal intervals  Normal axis, no ectopy no acute ischemic changes      4:45 PM  Patient refused all medications except IV fluids.  Also refusing contrast for her CAT scan    5:43 PM  Lab work unremarkable  Urine negative    Reviewed CT scan  Patient has markedly enlarged fibroids and enlarged uterus.  There is a mild to moderate degree of bladder distention as well  GI tract does reveal a prominent amount of stool but no evidence of ileus or obstruction.    At this point, it looks like most of the patient's symptoms are likely coming from the large uterine fibroids.      6:01 PM  Reviewed findings with patient  Answered questions to the best of my ability  Reaffirm need for GYN follow-up to discuss hysterectomy and removal of exophytic fibroid  Patient voiced understanding of instructions       Amount and/or Complexity of Data Reviewed  Clinical lab tests: ordered and reviewed  Tests in the radiology section of CPT??: ordered and reviewed  Tests in the medicine section of CPT??: reviewed and ordered  Decide to obtain previous medical records or to obtain history from  someone other than the patient: yes  Review and summarize past medical records: yes  Independent visualization of images, tracings, or specimens: yes    Risk of Complications, Morbidity, and/or Mortality  Presenting problems: moderate  Diagnostic procedures: moderate  Management options: moderate  General comments: Elements of this note have been dictated via voice recognition software.  Text and phrases may be limited by the accuracy of the software.  The chart has been reviewed, but errors may still be present.      Patient Progress  Patient progress: stable         Procedures

## 2019-05-08 ENCOUNTER — Ambulatory Visit: Attending: Obstetrics & Gynecology | Primary: Family

## 2019-05-08 ENCOUNTER — Ambulatory Visit
Admit: 2019-05-08 | Discharge: 2019-05-08 | Payer: PRIVATE HEALTH INSURANCE | Attending: Obstetrics & Gynecology | Primary: Family

## 2019-05-08 DIAGNOSIS — N920 Excessive and frequent menstruation with regular cycle: Secondary | ICD-10-CM

## 2019-05-08 LAB — EKG 12-LEAD
Atrial Rate: 76 {beats}/min
Diagnosis: NORMAL
P Axis: 73 degrees
P-R Interval: 166 ms
Q-T Interval: 386 ms
QRS Duration: 88 ms
QTc Calculation (Bazett): 434 ms
R Axis: 43 degrees
T Axis: 61 degrees
Ventricular Rate: 76 {beats}/min

## 2019-05-08 LAB — EKG, 12 LEAD, INITIAL
Atrial Rate: 76 {beats}/min
Calculated P Axis: 73 degrees
Calculated R Axis: 43 degrees
Calculated T Axis: 61 degrees
Diagnosis: NORMAL
P-R Interval: 166 ms
Q-T Interval: 386 ms
QRS Duration: 88 ms
QTC Calculation (Bezet): 434 ms
Ventricular Rate: 76 {beats}/min

## 2019-05-08 MED ORDER — TRAMADOL 50 MG TAB
50 mg | ORAL_TABLET | Freq: Two times a day (BID) | ORAL | 0 refills | Status: AC | PRN
Start: 2019-05-08 — End: 2019-05-20

## 2019-05-08 MED ORDER — NORETHINDRONE (CONTRACEPTIVE) 0.35 MG TAB
0.35 mg | PACK | Freq: Every day | ORAL | 12 refills | Status: DC
Start: 2019-05-08 — End: 2019-06-29

## 2019-05-08 NOTE — Assessment & Plan Note (Signed)
More likely than not due to signif enlarged uterine fibroids

## 2019-05-08 NOTE — Progress Notes (Signed)
Assessment/Plan     Problem List  Date Reviewed: 05/14/2019          Codes Class    Intramural, submucous, and subserous leiomyoma of uterus ICD-10-CM: D25.1, D25.0, D25.2  ICD-9-CM: 218.1, 218.0, 218.2     Overview Signed 05/08/2019 11:33 AM by Aldean Baker, MD     2018 MRI and CT scan last night similar results             Pelvic pain ICD-10-CM: R10.2  ICD-9-CM: [id number]     Overview Signed 05/08/2019 11:26 AM by Aldean Baker, MD     noted             Protein S deficiency [facility]) ICD-10-CM: D68.59  ICD-9-CM: 289.81     Overview Signed 05/08/2019 11:33 AM by Aldean Baker, MD     Known Hx per pt             Pap smear for cervical cancer screening ICD-10-CM: Z12.4  ICD-9-CM: V76.2     Overview Signed 05/08/2019 11:43 AM by Aldean Baker, MD     Pap + HPV done 05/08/2019             Anxiety ICD-10-CM: F41.9  ICD-9-CM: 300.00         Menorrhagia with regular cycle ICD-10-CM: N92.0  ICD-9-CM: 626.2     Overview Signed 05/08/2019 11:24 AM by Aldean Baker, MD     noted             Vitiligo ICD-10-CM: L80  ICD-9-CM: 709.01              Problem List Items Addressed This Visit        Reproductive    Intramural, submucous, and subserous leiomyoma of uterus     LENGTHY D/W pt about fibroids, etiology, treatment, etc.  Pt became tearful and wants something done ASAP but desires to preserve uterus for possible fertility.  Pt has seen [facility] in past - referral sent         Relevant Medications    progesterone (PROMETRIUM) 100 mg capsule    norethindrone (MICRONOR) 0.35 mg tab       Other    Menorrhagia with regular cycle     See A&P under fibroids         Relevant Medications    progesterone (PROMETRIUM) 100 mg capsule    traMADoL (Ultram) 50 mg tablet    norethindrone (MICRONOR) 0.35 mg tab    Pelvic pain     More likely than not due to signif enlarged uterine fibroids         Relevant Medications    traMADoL (Ultram) 50 mg tablet    Protein S deficiency ([place])     noted          Pap smear for cervical cancer screening    Relevant Orders    PAP IG, CT-NG-TV, APTIMA HPV AND RFX [phone])           Subjective       LAST PAP: Sept 2016 - neg, neg HPV    LAST MAMMO: never had one    LMP: Patient's last menstrual period was 04/16/2019 (approximate).    BIRTH CONTROL: none        Dorothy Gomez 38 y.o. G8 [phone] presents today for uterine fibroids/pain.  Pt has longstanding Hx of fibroids (prev seen by [facility]) with heavy painful menses and pelvic pain.  This has cont to worsen over  time but especially last night necessitating an ED visit.  Pt reports the pressure is "unbearable" at times.  Pain is present on or off menses. She notes menses are regular, monthly lasting 3-4 days.  (+) signif cramping on the 1st and second day with heavier flow. Denies any vaginal D/C, F/C, assoc GI/GU issues.  Denies any neuro changes, visual changes, H/A, CP, SOB.         OB History   Gravida Para Term Preterm AB Living   8       8 0   SAB TAB Ectopic Molar Multiple Live Births   8         0      # Outcome Date GA Lbr Len/2nd Weight Sex Delivery Anes PTL Lv   8 SAB            7 SAB            6 SAB            5 SAB            4 SAB            3 SAB            2 SAB            1 SAB                    Past Medical History:   Diagnosis Date   ??? Autosomal dominant congenital non-nuclear cataract    ??? 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 Vitiligo             Past Surgical History:   Procedure Laterality Date   ??? HX GYN      D&C   ??? HX WISDOM TEETH EXTRACTION      x1 left bottom           Family History   Problem Relation Age of Onset   ??? Celiac Disease Mother    ??? Diabetes Mother    ??? Hypertension Mother    ??? Hypertension Father    ??? No Known Problems Sister    ??? Asthma Brother    ??? Hypertension Maternal Grandmother    ??? Diabetes Maternal Grandmother    ??? No Known Problems Maternal Grandfather    ??? Breast Cancer Paternal Grandmother    ??? Diabetes Paternal Grandmother     ??? Hypertension Paternal Grandmother    ??? Heart Attack Paternal Grandfather    ??? Colon Cancer Neg Hx    ??? Ovarian Cancer Neg Hx    ??? Uterine Cancer Neg Hx            Social History     Socioeconomic History   ??? Marital status: MARRIED     Spouse name: Not on file   ??? Number of children: Not on file   ??? Years of education: Not on file   ??? Highest education level: Not on file   Occupational History   ??? Not on file   Social Needs   ??? Financial resource strain: Not on file   ??? Food insecurity     Worry: Not on file     Inability: Not on file   ??? Transportation needs     Medical: Not on file     Non-medical: Not on file   Tobacco Use   ??? Smoking status: Former Smoker     Last  attempt to quit: 05/27/2015     Years since quitting: 3.9   ??? Smokeless tobacco: Never Used   Substance and Sexual Activity   ??? Alcohol use: Yes     Alcohol/week: 7.0 standard drinks     Types: 3 Glasses of wine, 4 Shots of liquor per week   ??? Drug use: No   ??? Sexual activity: Yes     Partners: Male     Birth control/protection: None   Lifestyle   ??? Physical activity     Days per week: Not on file     Minutes per session: Not on file   ??? Stress: Not on file   Relationships   ??? Social Product manager on phone: Not on file     Gets together: Not on file     Attends religious service: Not on file     Active member of club or organization: Not on file     Attends meetings of clubs or organizations: Not on file     Relationship status: Not on file   ??? Intimate partner violence     Fear of current or ex partner: Not on file     Emotionally abused: Not on file     Physically abused: Not on file     Forced sexual activity: Not on file   Other Topics Concern   ??? Military Service Not Asked   ??? Blood Transfusions Not Asked   ??? Caffeine Concern No   ??? Occupational Exposure Not Asked   ??? Hobby Hazards Not Asked   ??? Sleep Concern Not Asked   ??? Stress Concern Not Asked   ??? Weight Concern Not Asked   ??? Special Diet Not Asked   ??? Back Care Not Asked    ??? Exercise No   ??? Bike Helmet Not Asked   ??? Seat Belt Yes   ??? Self-Exams Yes   Social History Narrative    Abuse: Feels safe at home, no history of physical abuse, no history of sexual abuse           Allergies   Allergen Reactions   ??? Macrobid [Nitrofurantoin Monohyd/M-Cryst] Rash           Review of Systems:     Constitutional:  No fevers or chills    Neuro:  No headaches, seizure activity    HEENT: no visual changes, bleeding gums    CV: No chest pain or palpitations    Resp: No SOB or cough    Breast:  No masses, pain, D/C, bleeding    GI:  No nausea/vomiting/diarrhea/constipation    GU: No dysuria or hematuria    MS:  No back, joint pain    Gyn:  As per HPI    Psych:  No depression, anxiety      Objective         Visit Vitals  BP 102/60 (BP 1 Location: Left arm, BP Patient Position: Sitting)   Temp 96.8 ??F (36 ??C) (Temporal)   Ht 5\' 6"  (1.676 m)   Wt 158 lb (71.7 kg)   BMI 25.50 kg/m??           Physical Exam:     General:  well developed, well nourished, in no acute distress     Head:   normocephalic and atraumatic     Lungs:  clear bilaterally with normal respirations     Heart:   regular rate and rhythm, S1, S2 without  murmurs     Abdomen:  bowel sounds positive; abdomen soft and non-tender without masses, organomegaly, or hernias noted     Pelvic Exam:       External: normal female genitalia without lesions or masses       Vagina: normal without lesions or masses       Cervix: normal without lesions or masses, pap done       Adnexa: normal bimanual exam without masses or fullness       Uterus: enlarged and irreg contour by palpation (18-20 weeks in total)    Msk:   no deformity or scoliosis noted with normal posture and gait     Skin:   intact without lesions or rashes     Psych:  alert and cooperative; normal mood and affect; normal attention span and concentration          Aldean Baker, MD     11:43 AM    05/08/2019

## 2019-05-08 NOTE — Assessment & Plan Note (Signed)
noted 

## 2019-05-08 NOTE — Assessment & Plan Note (Signed)
See A&P under fibroids

## 2019-05-08 NOTE — Progress Notes (Signed)
Patient comes in today for uterine fibroids. Patient states she was seen at [place] last night. Patient states she gets a period every month lasting for 3-4 days. Patient states she does have pain with her period and when she is not on her period. Patient states the fibroids are so large it is affecting her balance.     LAST PAP:  07/12/2015, Negative HPV Negative    LAST MAMMO:  Never    LMP:  Patient's last menstrual period was 04/16/2019 (approximate).    BIRTH CONTROL:  none    TOBACCO USE:  no    FAMILY HISTORY OF:   Breast Cancer:  yes   Ovarian Cancer:  no   Uterine Cancer:  no   Colon Cancer:  no    Visit Vitals  BP 102/60 (BP 1 Location: Left arm, BP Patient Position: Sitting)   Temp 96.8 ??F (36 ??C) (Temporal)   Ht 5\' 6"  (1.676 m)   Wt 158 lb (71.7 kg)   BMI 25.50 kg/m??       Yanilen Adamik L Pleasant Britz  05/08/19  11:04 AM

## 2019-05-08 NOTE — Patient Instructions (Signed)
You can start your birth control pills today. You should try to take them around the same time each day.  Remember the pills may cause irregular bleeding for the first couple of months when starting pills.   You should start taking ibuprofen 400-600 mg 2-3 times a day with food about 1-2 days before your period even starts to help cut the bleeding and cramping by half.   We will call you if anything is abnormal from your testing today.   Please follow up with [facility] as scheduled  Thanks for coming to see [place] today and letting us take care of you!

## 2019-05-08 NOTE — Progress Notes (Signed)
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: D68.59  ICD-9-CM: 289.81     Overview Signed 05/08/2019 11:33 AM by Aldean Baker, MD     Known Hx per pt             Pap smear for cervical cancer screening ICD-10-CM: Z12.4  ICD-9-CM: V76.2     Overview Signed 05/08/2019 11:43 AM by Aldean Baker, MD     Pap + HPV done 05/08/2019             Anxiety ICD-10-CM: F41.9  ICD-9-CM: 300.00         Menorrhagia with regular cycle ICD-10-CM: N92.0  ICD-9-CM: 626.2     Overview Signed 05/08/2019 11:24 AM by Aldean Baker, MD     noted             Vitiligo ICD-10-CM: L80  ICD-9-CM: 709.01              Problem List Items Addressed This Visit        Reproductive    Intramural, submucous, and subserous leiomyoma of uterus     LENGTHY D/W pt about fibroids, etiology, treatment, etc.  Pt became tearful and wants something done ASAP but desires to preserve uterus for possible fertility.  Pt has seen [facility] in past - referral sent         Relevant Medications    progesterone (PROMETRIUM) 100 mg capsule    norethindrone (MICRONOR) 0.35 mg tab       Other    Menorrhagia with regular cycle     See A&P under fibroids         Relevant Medications    progesterone (PROMETRIUM) 100 mg capsule    traMADoL (Ultram) 50 mg tablet    norethindrone (MICRONOR) 0.35 mg tab    Pelvic pain     More likely than not due to signif enlarged uterine fibroids         Relevant Medications    traMADoL (Ultram) 50 mg tablet    Protein S deficiency ([place])     noted         Pap smear for  cervical cancer screening    Relevant Orders    PAP IG, CT-NG-TV, APTIMA HPV AND RFX [phone])           Subjective       LAST PAP: Sept 2016 - neg, neg HPV    LAST MAMMO: never had one    LMP: Patient's last menstrual period was 04/16/2019 (approximate).    BIRTH CONTROL: none        Dorothy Gomez 38 y.o. G8 [phone] presents today for uterine fibroids/pain.  Pt has longstanding Hx of fibroids (prev seen by [facility]) with heavy painful menses and pelvic pain.  This has cont to worsen over  time but especially last night necessitating an ED visit.  Pt reports the pressure is "unbearable" at times.  Pain is present on or off menses. She notes menses are regular, monthly lasting 3-4 days.  (+) signif cramping on the 1st and second day with heavier flow. Denies any vaginal D/C, F/C, assoc GI/GU issues.  Denies any neuro changes, visual changes, H/A, CP, SOB.         OB History   Gravida Para Term Preterm AB Living   8       8 0   SAB TAB Ectopic Molar Multiple Live Births   8         0      # Outcome Date GA Lbr Len/2nd Weight Sex Delivery Anes PTL Lv   8 SAB            7 SAB            6 SAB            5 SAB            4 SAB            3 SAB            2 SAB            1 SAB                    Past Medical History:   Diagnosis Date   ??? Autosomal dominant congenital non-nuclear cataract    ??? Protein S deficiency affecting pregnancy ([place])    ??? Vitiligo             Past Surgical History:   Procedure Laterality Date   ??? HX GYN      D&C   ??? HX WISDOM TEETH EXTRACTION      x1 left bottom           Family History   Problem Relation Age of Onset   ??? Celiac Disease Mother    ??? Diabetes Mother    ??? Hypertension Mother    ??? Hypertension Father    ??? No Known Problems Sister    ??? Asthma Brother    ??? Hypertension Maternal Grandmother    ??? Diabetes Maternal Grandmother    ??? No Known Problems Maternal Grandfather    ??? Breast Cancer Paternal Grandmother    ??? Diabetes Paternal Grandmother    ??? Hypertension Paternal  Grandmother    ??? Heart Attack Paternal Grandfather    ??? Colon Cancer Neg Hx    ??? Ovarian Cancer Neg Hx    ??? Uterine Cancer Neg Hx            Social History     Socioeconomic History   ??? Marital status: MARRIED     Spouse name: Not on file   ??? Number of children: Not on file   ??? Years of education: Not on file   ??? Highest education level: Not on file   Occupational History   ??? Not on file   Social Needs   ??? Financial resource strain: Not on file   ??? Food insecurity     Worry: Not on file     Inability: Not on file   ??? Transportation needs     Medical: Not on file     Non-medical: Not on file   Tobacco Use   ??? Smoking status: Former Smoker     Last  attempt to quit: 05/27/2015     Years since quitting: 3.9   ??? Smokeless tobacco: Never Used   Substance and Sexual Activity   ??? Alcohol use: Yes     Alcohol/week: 7.0 standard drinks     Types: 3 Glasses of wine, 4 Shots of liquor per week   ??? Drug use: No   ??? Sexual activity: Yes     Partners: Male     Birth control/protection: None   Lifestyle   ??? Physical activity     Days per week: Not on file     Minutes per session: Not on file   ??? Stress: Not on file   Relationships   ??? Social Product manager on phone: Not on file     Gets together: Not on file     Attends religious service: Not on file     Active member of club or organization: Not on file     Attends meetings of clubs or organizations: Not on file     Relationship status: Not on file   ??? Intimate partner violence     Fear of current or ex partner: Not on file     Emotionally abused: Not on file     Physically abused: Not on file     Forced sexual activity: Not on file   Other Topics Concern   ??? Military Service Not Asked   ??? Blood Transfusions Not Asked   ??? Caffeine Concern No   ??? Occupational Exposure Not Asked   ??? Hobby Hazards Not Asked   ??? Sleep Concern Not Asked   ??? Stress Concern Not Asked   ??? Weight Concern Not Asked   ??? Special Diet Not Asked   ??? Back Care Not Asked   ??? Exercise No   ??? Bike Helmet Not  Asked   ??? Seat Belt Yes   ??? Self-Exams Yes   Social History Narrative    Abuse: Feels safe at home, no history of physical abuse, no history of sexual abuse           Allergies   Allergen Reactions   ??? Macrobid [Nitrofurantoin Monohyd/M-Cryst] Rash           Review of Systems:     Constitutional:  No fevers or chills    Neuro:  No headaches, seizure activity    HEENT: no visual changes, bleeding gums    CV: No chest pain or palpitations    Resp: No SOB or cough    Breast:  No masses, pain, D/C, bleeding    GI:  No nausea/vomiting/diarrhea/constipation    GU: No dysuria or hematuria    MS:  No back, joint pain    Gyn:  As per HPI    Psych:  No depression, anxiety      Objective         Visit Vitals  BP 102/60 (BP 1 Location: Left arm, BP Patient Position: Sitting)   Temp 96.8 ??F (36 ??C) (Temporal)   Ht 5\' 6"  (1.676 m)   Wt 158 lb (71.7 kg)   BMI 25.50 kg/m??           Physical Exam:     General:  well developed, well nourished, in no acute distress     Head:   normocephalic and atraumatic     Lungs:  clear bilaterally with normal respirations     Heart:   regular rate and rhythm, S1, S2 without  murmurs     Abdomen:  bowel sounds positive; abdomen soft and non-tender without masses, organomegaly, or hernias noted     Pelvic Exam:       External: normal female genitalia without lesions or masses       Vagina: normal without lesions or masses       Cervix: normal without lesions or masses, pap done       Adnexa: normal bimanual exam without masses or fullness       Uterus: enlarged and irreg contour by palpation (18-20 weeks in total)    Msk:   no deformity or scoliosis noted with normal posture and gait     Skin:   intact without lesions or rashes     Psych:  alert and cooperative; normal mood and affect; normal attention span and concentration          Aldean Baker, MD     11:43 AM    05/08/2019

## 2019-05-08 NOTE — Progress Notes (Signed)
 Patient comes in today for uterine fibroids. Patient states she was seen at [facility] ER last night. Patient states she gets a period every month lasting for 3-4 days. Patient states she does have pain with her period and when she is not on her period. Patient states the fibroids are so large it is affecting her balance.     LAST PAP:  07/12/2015, Negative HPV Negative    LAST MAMMO:  Never    LMP:  Patient's last menstrual period was 04/16/2019 (approximate).    BIRTH CONTROL:  none    TOBACCO USE:  no    FAMILY HISTORY OF:   Breast Cancer:  yes   Ovarian Cancer:  no   Uterine Cancer:  no   Colon Cancer:  no    Visit Vitals  BP 102/60 (BP 1 Location: Left arm, BP Patient Position: Sitting)   Temp 96.8 F (36 C) (Temporal)   Ht 5' 6 (1.676 m)   Wt 158 lb (71.7 kg)   BMI 25.50 kg/m       Dorothy Gomez  05/08/19  11:04 AM

## 2019-05-10 NOTE — Telephone Encounter (Signed)
Pt called back to the office yesterday.  She and her husband have decided that they no longer desire pregnancy and wish to proceed with TAH due to pain and bleeding.  Please have her either come back in or virtual visit to discuss this.  Aldean Baker, MD  7:49 AM  05/10/19

## 2019-05-10 NOTE — Telephone Encounter (Signed)
LMVM for patient to call the office to set up a virtual visit. No other details were given. mc

## 2019-05-10 NOTE — Assessment & Plan Note (Signed)
LENGTHY D/W pt about fibroids, etiology, treatment, etc.  Pt became tearful and wants something done ASAP but desires to preserve uterus for possible fertility.  Pt has seen [facility] in past - referral sent

## 2019-05-10 NOTE — Telephone Encounter (Signed)
Would you please call patient and make an appointment for Virtual visit with Dr. Bonney Leitz. Thanks!

## 2019-05-12 NOTE — Telephone Encounter (Signed)
I called patient to get her set up with an appointment. Patient states she wants to see [facility] before she sees Dr. Bonney Leitz again. I advised her that I will send the referral to [facility]. Patient voiced understanding.  mc

## 2019-05-18 LAB — PAP IG, CT-NG-TV, APTIMA HPV AND RFX 16/18,45 (199315)
CHLAMYDIA, NUC. ACID AMP, 186134: NEGATIVE
GONOCOCCUS, NUC. ACID AMP, 186135: NEGATIVE
HPV Aptima: NEGATIVE
LABCORP 019018: 0
TRICH VAG BY NAA: NEGATIVE

## 2019-05-18 LAB — PAP IG, CT-NG-TV, APTIMA HPV AND RFX 16/18,45(183160,507805)
.: 0
Chlamydia, Nuc. Acid Amp: NEGATIVE
Gonococcus, Nuc. Acid Amp: NEGATIVE
HPV APTIMA: NEGATIVE
Trich vag by NAA: NEGATIVE

## 2019-05-19 IMAGING — CR DG CHEST 2V
2 series · 2 of 2 positions shown · non-contrast
Comparison: Radiographs November 08, 2018.

CLINICAL DATA: Cough, fever, shortness of breath.

EXAM:
CHEST - 2 VIEW

[w chest pa]
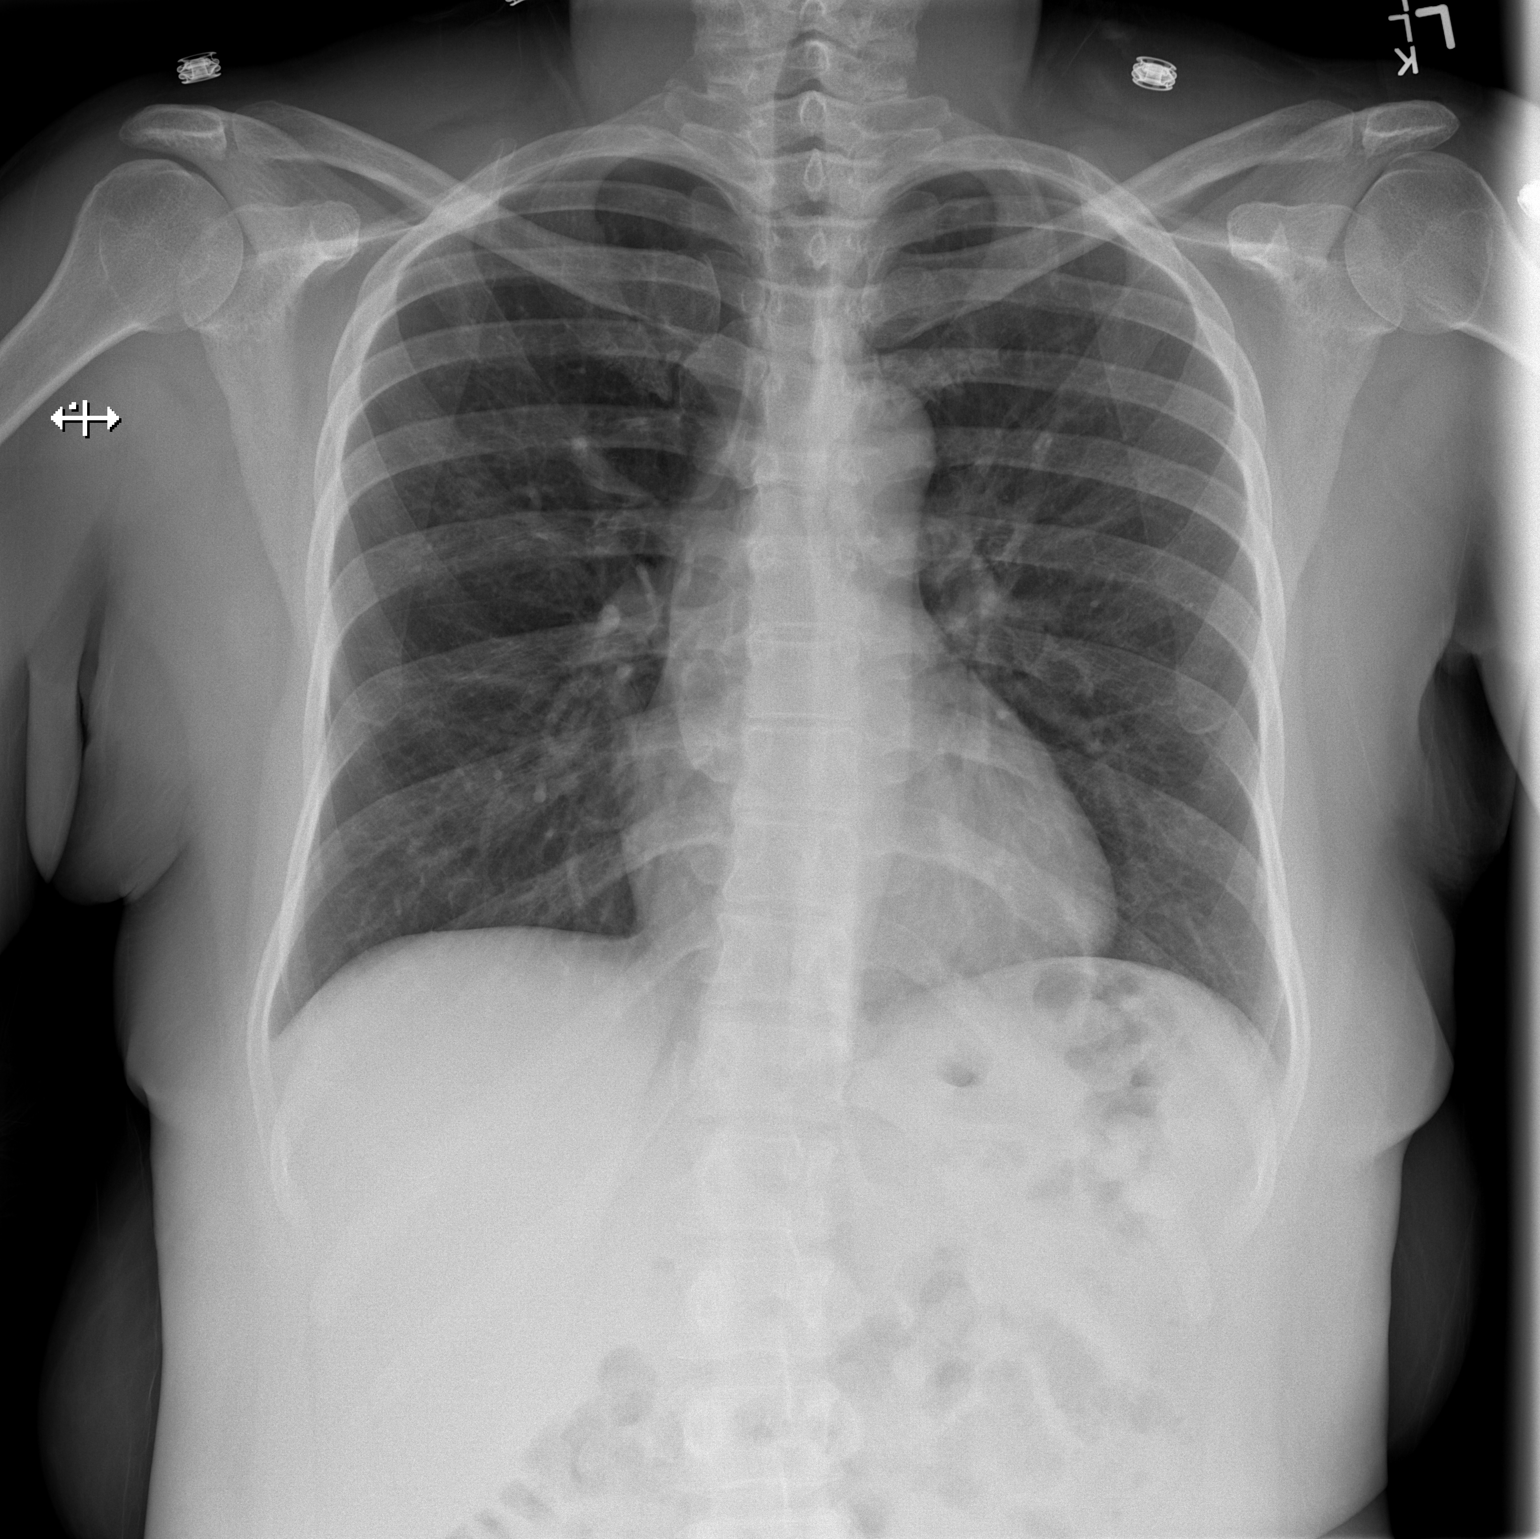

[w chest lat]
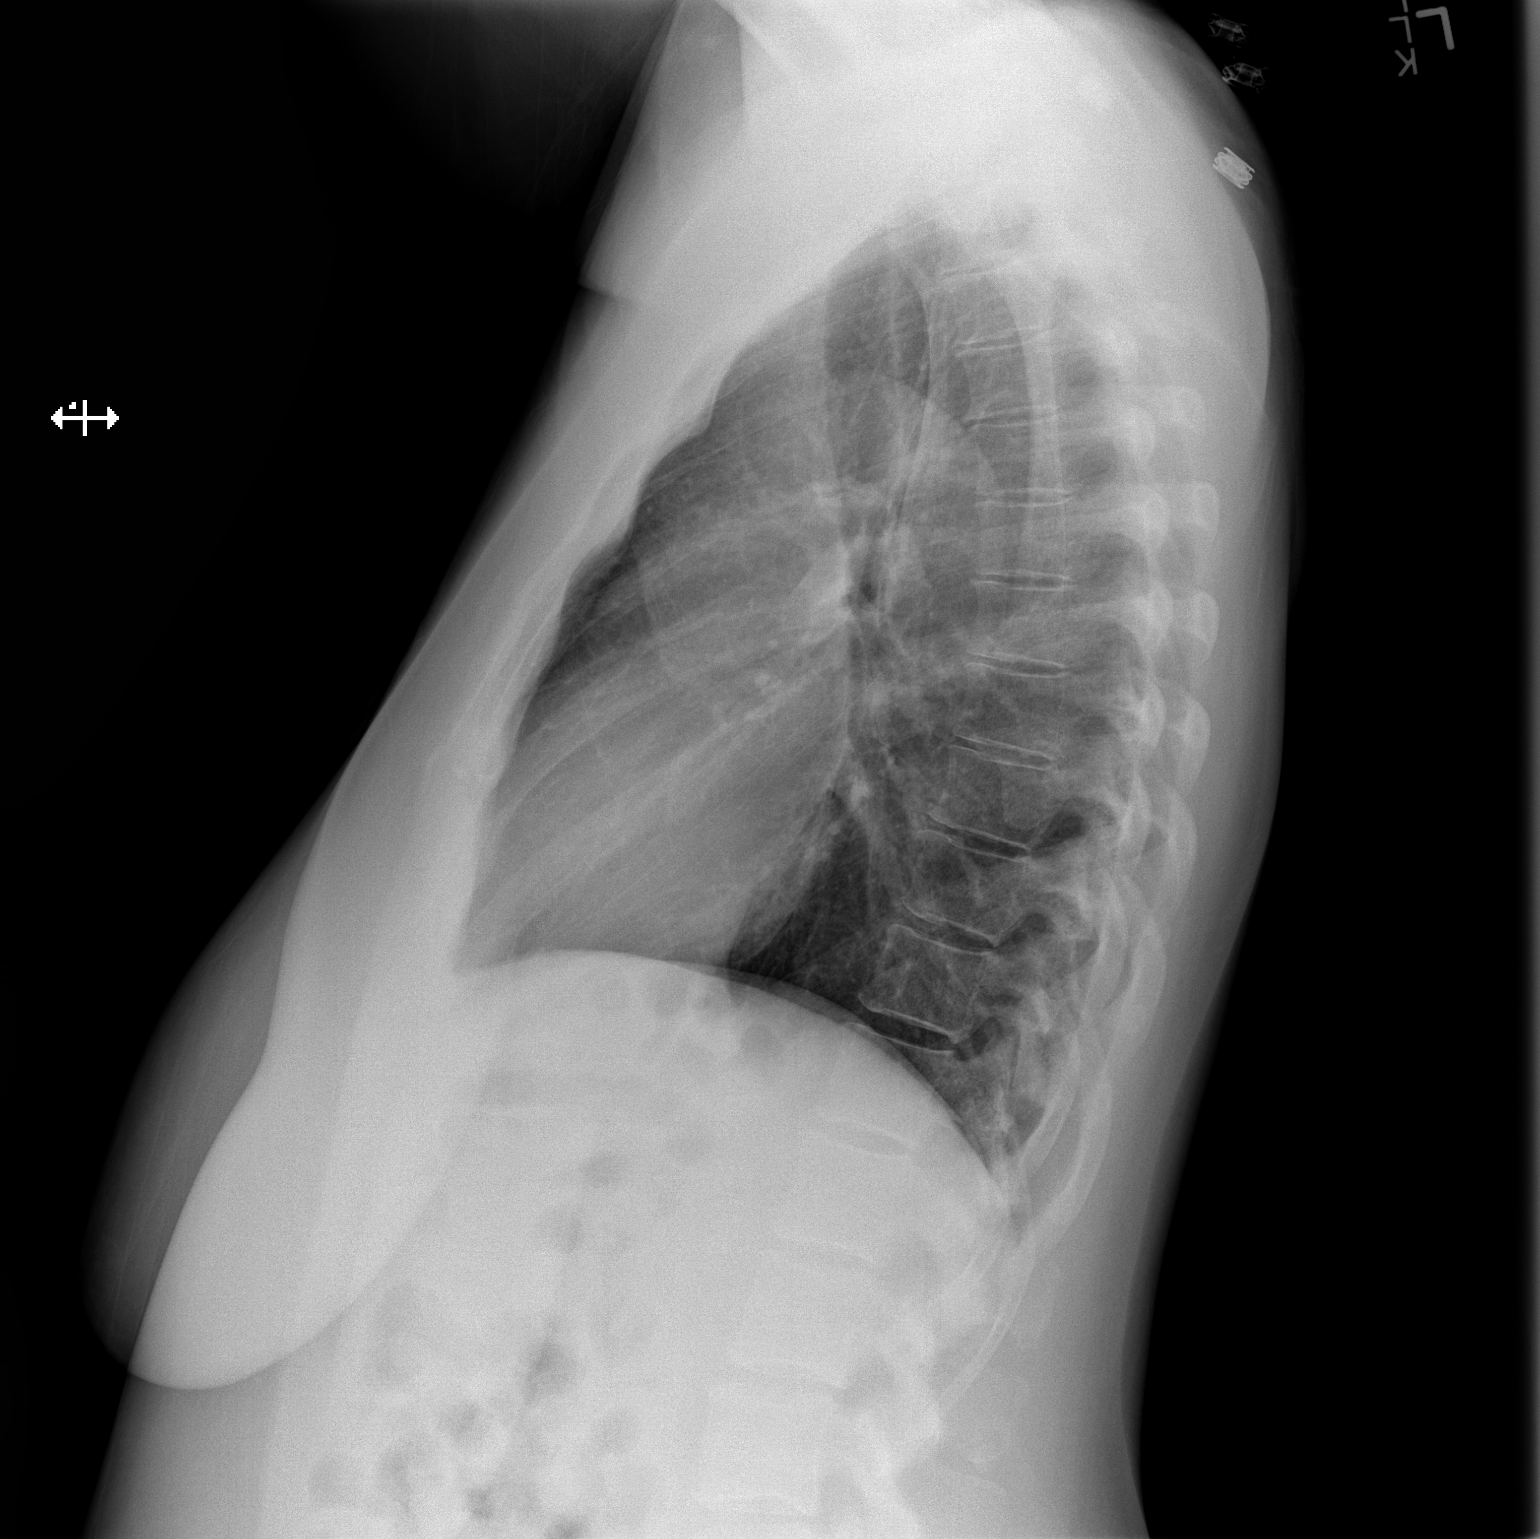

[2 of 2 positions shown; findings below may reference images not displayed]

FINDINGS: The heart size and mediastinal contours are within normal limits.
Both lungs are clear. No pneumothorax or pleural effusion is noted.
The visualized skeletal structures are unremarkable.
IMPRESSION: No active cardiopulmonary disease.

## 2019-06-15 ENCOUNTER — Encounter

## 2019-06-29 ENCOUNTER — Telehealth: Attending: Obstetrics & Gynecology | Primary: Family

## 2019-06-29 ENCOUNTER — Telehealth
Admit: 2019-06-29 | Discharge: 2019-06-29 | Payer: PRIVATE HEALTH INSURANCE | Attending: Obstetrics & Gynecology | Primary: Family

## 2019-06-29 ENCOUNTER — Encounter: Payer: Self-pay | Admitting: Family Medicine

## 2019-06-29 ENCOUNTER — Encounter: Payer: Self-pay | Admitting: Hematology and Oncology

## 2019-06-29 DIAGNOSIS — D251 Intramural leiomyoma of uterus: Secondary | ICD-10-CM

## 2019-06-29 NOTE — Assessment & Plan Note (Signed)
Discussed with patient that she will need recommendations from hematology potential anticoagulation prior to and post surgery prior to any consideration for surgery

## 2019-06-29 NOTE — Progress Notes (Signed)
Patient is here for OV-Virtual Visit. Patient states she would like to discuss having surgery for fibroids. Patient states her and her husband have decided not to conceive.    LAST PAP:  05/08/2019, Negative HPV Negative    LAST MAMMO:  Never    LMP:  06/08/2019    BIRTH CONTROL:  none    TOBACCO USE:  no    FAMILY HISTORY OF:   Breast Cancer:  no   Ovarian Cancer:  no   Uterine Cancer:  no   Colon Cancer:  no    There were no vitals taken for this visit.    Dorothy Gomez  06/29/19  2:51 PM

## 2019-06-29 NOTE — Assessment & Plan Note (Signed)
More likely than not due to signif enlarged uterine fibroids

## 2019-06-29 NOTE — Assessment & Plan Note (Signed)
2018 MRI and CT scan July 2020 similar results  After lengthy D/W with her and her husband, they agree that they no longer wish to conceive and would like more definitve treatment for the fibroids due to the severe pain that affects her ADLs    PLAN:  TAH-BS  D/W pt at length risks/benefits of procedure including but not limited to death, bleeding, infection, permanence, inability to bear children and damage to other internal organs. She exhibited full understanding and wishes to proceed.

## 2019-06-29 NOTE — Progress Notes (Signed)
Dorothy Gomez is a 38 y.o. female evaluated via telephone/virtual video visit on 07/13/2019.      Consent:  Patient is aware that that she may receive a bill for this service, depending on her insurance coverage, and has provided verbal consent to proceed    I affirm this is a Patient Initiated Episode with an Established Patient who has not had a related appointment within my department in the past 7 days or scheduled within the next 24 hours.    Patient's medical, surgical and obstetric history, problem list, and current medications were reviewed and patient was deemed an appropriate candidate for virtual/telemedicine visits. Patient was offered this option and agreed to proceed with virtual/telemedicine visits    This visit was done with the [email or web address] platform and all documentation was done in Epic.     VIRTUAL GYN VISIT    Assessment/Plan     Problem List  Date Reviewed: 07/13/2019          Codes Class    Intramural, submucous, and subserous leiomyoma of uterus ICD-10-CM: D25.1, D25.0, D25.2  ICD-9-CM: 218.1, 218.0, 218.2     Overview Addendum 07/13/2019  3:23 PM by Aldean Baker, MD     2018 MRI and CT scan July 2020 similar results  After lengthy D/W with her and her husband, they agree that they no longer wish to conceive and would like more definitve treatment for the fibroids due to the severe pain that affects her ADLs    PLAN:  TAH-BS  D/W pt at length risks/benefits of procedure including but not limited to death, bleeding, infection, permanence, inability to bear children and damage to other internal organs. She exhibited full understanding and wishes to proceed.               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  ICD-9-CM: 289.81     Overview Signed 05/08/2019 11:33 AM by Aldean Baker, MD     Known Hx per pt              Pap smear for cervical cancer screening ICD-10-CM: Z12.4  ICD-9-CM: V76.2     Overview Signed 05/08/2019 11:43 AM by Aldean Baker, MD     Pap + HPV done 05/08/2019             Anxiety ICD-10-CM: F41.9  ICD-9-CM: 300.00         Menorrhagia with regular cycle ICD-10-CM: N92.0  ICD-9-CM: 626.2     Overview Signed 05/08/2019 11:24 AM by Aldean Baker, MD     noted             Vitiligo ICD-10-CM: L80  ICD-9-CM: 709.01              Problem List Items Addressed This Visit        Reproductive    Intramural, submucous, and subserous leiomyoma of uterus - Primary     2018 MRI and CT scan July 2020 similar results  After lengthy D/W with her and her husband, they agree that they no longer wish to conceive and would like more definitve treatment for the fibroids due to the severe pain that affects her ADLs    PLAN:  TAH-BS  D/W pt at length risks/benefits of procedure  including but not limited to death, bleeding, infection, permanence, inability to bear children and damage to other internal organs. She exhibited full understanding and wishes to proceed.              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    BIRTH CONTROL: none        Dorothy Gomez 38 y.o. G8 [phone] presents today for discussion of surgery for large uterine fibroids.  Patient continues to have heavy menstrual cycles with significant pelvic pain.  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  Patient also reports the previous protein S diagnosis was recently deemed  negative.  No other new issues or complaints.Denies any vaginal D/C,  F/C, assoc GI/GU issues.  Denies any neuro changes, visual changes, H/A, CP, SOB.         OB History   Gravida Para Term Preterm AB Living   8       8 0   SAB TAB Ectopic Molar Multiple Live Births   8         0      # Outcome Date GA Lbr Len/2nd Weight Sex Delivery Anes PTL Lv   8 SAB            7 SAB            6 SAB            5 SAB            4 SAB            3 SAB            2 SAB            1 SAB                    Past Medical History:   Diagnosis Date   ??? Autosomal dominant congenital non-nuclear cataract    ??? Other conditions associated with Lyme disease    ??? Protein S deficiency affecting pregnancy ([place])    ??? Vitiligo             Past Surgical History:   Procedure Laterality Date   ??? HX GYN      D&C   ??? HX WISDOM TEETH EXTRACTION      x1 left bottom           Family History   Problem Relation Age of Onset   ??? Celiac Disease Mother    ??? Diabetes Mother    ??? Hypertension Mother    ??? Hypertension Father    ??? No Known Problems Sister    ??? Asthma Brother    ??? Hypertension Maternal Grandmother    ??? Diabetes Maternal Grandmother    ???  No Known Problems Maternal Grandfather    ??? Breast Cancer Paternal Grandmother    ??? Diabetes Paternal Grandmother    ??? Hypertension Paternal Grandmother    ??? Heart Attack Paternal Grandfather    ??? Colon Cancer Neg Hx    ??? Ovarian Cancer Neg Hx    ??? Uterine Cancer Neg Hx            Social History     Socioeconomic History   ??? Marital status: MARRIED     Spouse name: Not on file   ??? Number of children: Not on file   ??? Years of education: Not on file   ??? Highest education level: Not on file   Occupational History   ??? Not on file   Social Needs   ??? Financial resource strain: Not on file   ??? Food insecurity     Worry: Not on file     Inability: Not on file   ??? Transportation needs     Medical: Not on file     Non-medical: Not on file   Tobacco Use   ??? Smoking status: Former Smoker     Last attempt to quit: 05/27/2015      Years since quitting: 4.0   ??? Smokeless tobacco: Never Used   Substance and Sexual Activity   ??? Alcohol use: Yes     Alcohol/week: 7.0 standard drinks     Types: 3 Glasses of wine, 4 Shots of liquor per week   ??? Drug use: No   ??? Sexual activity: Yes     Partners: Male     Birth control/protection: None   Lifestyle   ??? Physical activity     Days per week: Not on file     Minutes per session: Not on file   ??? Stress: Not on file   Relationships   ??? Social Product manager on phone: Not on file     Gets together: Not on file     Attends religious service: Not on file     Active member of club or organization: Not on file     Attends meetings of clubs or organizations: Not on file     Relationship status: Not on file   ??? Intimate partner violence     Fear of current or ex partner: Not on file     Emotionally abused: Not on file     Physically abused: Not on file     Forced sexual activity: Not on file   Other Topics Concern   ??? Military Service Not Asked   ??? Blood Transfusions Not Asked   ??? Caffeine Concern No   ??? Occupational Exposure Not Asked   ??? Hobby Hazards Not Asked   ??? Sleep Concern Not Asked   ??? Stress Concern Not Asked   ??? Weight Concern Not Asked   ??? Special Diet Not Asked   ??? Back Care Not Asked   ??? Exercise No   ??? Bike Helmet Not Asked   ??? Seat Belt Yes   ??? Self-Exams Yes   Social History Narrative    Abuse: Feels safe at home, no history of physical abuse, no history of sexual abuse           Allergies   Allergen Reactions   ??? Macrobid [Nitrofurantoin Monohyd/M-Cryst] Rash           Review of Systems:     Constitutional:  No fevers or chills  Neuro:  No headaches, seizure activity    HEENT: no visual changes, bleeding gums    CV: No chest pain or palpitations    Resp: No SOB or cough    Breast:  No masses, pain, D/C, bleeding    GI:  No nausea/vomiting/diarrhea/constipation    GU: No dysuria or hematuria    MS:  No back, joint pain    Gyn:  As per HPI    Psych:  No depression, (+) anxiety       Objective     No vitals taken today due to virtual visit      Physical Exam (limited due to virtual visit):     General:  appears in no acute distress     Head:   normocephalic and atraumatic     Mouth:  no deformity or lesions appreciated, good dentition     Lungs:  normal respirations with no audible wheezes or cough    Extremities: no edema noted, normal full range of motion of all joints appreciated    Neurologic:  no focal cranial nerve deficits    Pelvic:  unable to perform due to virtual visit    Psych:  alert and cooperative; normal mood and affect; normal attention span and concentration            Aldean Baker, MD     3:23 PM    06/29/19

## 2019-06-29 NOTE — Patient Instructions (Addendum)
You will need to be evaluated by hematology with recommendations prior to any surgery being scheduled  Let us know as soon as you get this information and we can schedule your surgery done  Thanks for coming to see [place] today and letting us take care of you!

## 2019-06-29 NOTE — Assessment & Plan Note (Signed)
See A&P under fibroids

## 2019-06-29 NOTE — Progress Notes (Signed)
Patient is here for OV-Virtual Visit. Patient states she would like to discuss having surgery for fibroids. Patient states her and her husband have decided not to conceive.    LAST PAP:  05/08/2019, Negative HPV Negative    LAST MAMMO:  Never    LMP:  06/08/2019    BIRTH CONTROL:  none    TOBACCO USE:  no    FAMILY HISTORY OF:   Breast Cancer:  no   Ovarian Cancer:  no   Uterine Cancer:  no   Colon Cancer:  no    There were no vitals taken for this visit.    Missie L Cantrell  06/29/19  2:51 PM

## 2019-06-29 NOTE — Progress Notes (Signed)
Dorothy Gomez is a 38 y.o. female evaluated via telephone/virtual video visit on 2019/07/08.      Consent:  Patient is aware that that she may receive a bill for this service, depending on her insurance coverage, and has provided verbal consent to proceed    I affirm this is a Patient Initiated Episode with an Established Patient who has not had a related appointment within my department in the past 7 days or scheduled within the next 24 hours.    Patient's medical, surgical and obstetric history, problem list, and current medications were reviewed and patient was deemed an appropriate candidate for virtual/telemedicine visits. Patient was offered this option and agreed to proceed with virtual/telemedicine visits    This visit was done with the [email or web address] platform and all documentation was done in Epic.     VIRTUAL GYN VISIT    Assessment/Plan     Problem List  Date Reviewed: 07/08/2019          Codes Class    Intramural, submucous, and subserous leiomyoma of uterus ICD-10-CM: D25.1, D25.0, D25.2  ICD-9-CM: 218.1, 218.0, 218.2     Overview Addendum 07-08-19  3:23 PM by Aldean Baker, MD     2018 MRI and CT scan July 2020 similar results  After lengthy D/W with her and her husband, they agree that they no longer wish to conceive and would like more definitve treatment for the fibroids due to the severe pain that affects her ADLs    PLAN:  TAH-BS  D/W pt at length risks/benefits of procedure including but not limited to death, bleeding, infection, permanence, inability to bear children and damage to other internal organs. She exhibited full understanding and wishes to proceed.               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  ICD-9-CM: 289.81     Overview Signed 05/08/2019 11:33 AM by Aldean Baker, MD     Known Hx per pt             Pap smear for cervical  cancer screening ICD-10-CM: Z12.4  ICD-9-CM: V76.2     Overview Signed 05/08/2019 11:43 AM by Aldean Baker, MD     Pap + HPV done 05/08/2019             Anxiety ICD-10-CM: F41.9  ICD-9-CM: 300.00         Menorrhagia with regular cycle ICD-10-CM: N92.0  ICD-9-CM: 626.2     Overview Signed 05/08/2019 11:24 AM by Aldean Baker, MD     noted             Vitiligo ICD-10-CM: L80  ICD-9-CM: 709.01              Problem List Items Addressed This Visit        Reproductive    Intramural, submucous, and subserous leiomyoma of uterus - Primary     2018 MRI and CT scan July 2020 similar results  After lengthy D/W with her and her husband, they agree that they no longer wish to conceive and would like more definitve treatment for the fibroids due to the severe pain that affects her ADLs    PLAN:  TAH-BS  D/W pt at length risks/benefits of procedure  including but not limited to death, bleeding, infection, permanence, inability to bear children and damage to other internal organs. She exhibited full understanding and wishes to proceed.              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    BIRTH CONTROL: none        Dorothy Gomez 38 y.o. G8 [phone] presents today for discussion of surgery for large uterine fibroids.  Patient continues to have heavy menstrual cycles with significant pelvic pain.  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  Patient also reports the previous protein S diagnosis was recently deemed negative.  No other new issues or  complaints.Denies any vaginal D/C,  F/C, assoc GI/GU issues.  Denies any neuro changes, visual changes, H/A, CP, SOB.         OB History   Gravida Para Term Preterm AB Living   8       8 0   SAB TAB Ectopic Molar Multiple Live Births   8         0      # Outcome Date GA Lbr Len/2nd Weight Sex Delivery Anes PTL Lv   8 SAB            7 SAB            6 SAB            5 SAB            4 SAB            3 SAB            2 SAB            1 SAB                    Past Medical History:   Diagnosis Date   ??? Autosomal dominant congenital non-nuclear cataract    ??? Other conditions associated with Lyme disease    ??? Protein S deficiency affecting pregnancy ([place])    ??? Vitiligo             Past Surgical History:   Procedure Laterality Date   ??? HX GYN      D&C   ??? HX WISDOM TEETH EXTRACTION      x1 left bottom           Family History   Problem Relation Age of Onset   ??? Celiac Disease Mother    ??? Diabetes Mother    ??? Hypertension Mother    ??? Hypertension Father    ??? No Known Problems Sister    ??? Asthma Brother    ??? Hypertension Maternal Grandmother    ??? Diabetes Maternal Grandmother    ???  No Known Problems Maternal Grandfather    ??? Breast Cancer Paternal Grandmother    ??? Diabetes Paternal Grandmother    ??? Hypertension Paternal Grandmother    ??? Heart Attack Paternal Grandfather    ??? Colon Cancer Neg Hx    ??? Ovarian Cancer Neg Hx    ??? Uterine Cancer Neg Hx            Social History     Socioeconomic History   ??? Marital status: MARRIED     Spouse name: Not on file   ??? Number of children: Not on file   ??? Years of education: Not on file   ??? Highest education level: Not on file   Occupational History   ??? Not on file   Social Needs   ??? Financial resource strain: Not on file   ??? Food insecurity     Worry: Not on file     Inability: Not on file   ??? Transportation needs     Medical: Not on file     Non-medical: Not on file   Tobacco Use   ??? Smoking status: Former Smoker     Last attempt to quit: 05/27/2015     Years since quitting: 4.0   ???  Smokeless tobacco: Never Used   Substance and Sexual Activity   ??? Alcohol use: Yes     Alcohol/week: 7.0 standard drinks     Types: 3 Glasses of wine, 4 Shots of liquor per week   ??? Drug use: No   ??? Sexual activity: Yes     Partners: Male     Birth control/protection: None   Lifestyle   ??? Physical activity     Days per week: Not on file     Minutes per session: Not on file   ??? Stress: Not on file   Relationships   ??? Social Product manager on phone: Not on file     Gets together: Not on file     Attends religious service: Not on file     Active member of club or organization: Not on file     Attends meetings of clubs or organizations: Not on file     Relationship status: Not on file   ??? Intimate partner violence     Fear of current or ex partner: Not on file     Emotionally abused: Not on file     Physically abused: Not on file     Forced sexual activity: Not on file   Other Topics Concern   ??? Military Service Not Asked   ??? Blood Transfusions Not Asked   ??? Caffeine Concern No   ??? Occupational Exposure Not Asked   ??? Hobby Hazards Not Asked   ??? Sleep Concern Not Asked   ??? Stress Concern Not Asked   ??? Weight Concern Not Asked   ??? Special Diet Not Asked   ??? Back Care Not Asked   ??? Exercise No   ??? Bike Helmet Not Asked   ??? Seat Belt Yes   ??? Self-Exams Yes   Social History Narrative    Abuse: Feels safe at home, no history of physical abuse, no history of sexual abuse           Allergies   Allergen Reactions   ??? Macrobid [Nitrofurantoin Monohyd/M-Cryst] Rash           Review of Systems:     Constitutional:  No fevers or chills  Neuro:  No headaches, seizure activity    HEENT: no visual changes, bleeding gums    CV: No chest pain or palpitations    Resp: No SOB or cough    Breast:  No masses, pain, D/C, bleeding    GI:  No nausea/vomiting/diarrhea/constipation    GU: No dysuria or hematuria    MS:  No back, joint pain    Gyn:  As per HPI    Psych:  No depression, (+) anxiety      Objective     No vitals taken today  due to virtual visit      Physical Exam (limited due to virtual visit):     General:  appears in no acute distress     Head:   normocephalic and atraumatic     Mouth:  no deformity or lesions appreciated, good dentition     Lungs:  normal respirations with no audible wheezes or cough    Extremities: no edema noted, normal full range of motion of all joints appreciated    Neurologic:  no focal cranial nerve deficits    Pelvic:  unable to perform due to virtual visit    Psych:  alert and cooperative; normal mood and affect; normal attention span and concentration            Aldean Baker, MD     3:23 PM    06/29/19

## 2019-07-05 ENCOUNTER — Ambulatory Visit: Payer: PRIVATE HEALTH INSURANCE | Primary: Family

## 2019-07-28 ENCOUNTER — Ambulatory Visit: Payer: PRIVATE HEALTH INSURANCE | Primary: Family
